# Patient Record
Sex: Male | Born: 1965
Health system: Southern US, Community
[De-identification: ages and names within clinical notes are randomized; demographics above are authoritative.]

## PROBLEM LIST (undated history)

## (undated) DIAGNOSIS — R51 Headache: Secondary | ICD-10-CM

## (undated) DIAGNOSIS — R519 Headache, unspecified: Secondary | ICD-10-CM

## (undated) DIAGNOSIS — T7840XA Allergy, unspecified, initial encounter: Secondary | ICD-10-CM

## (undated) DIAGNOSIS — K219 Gastro-esophageal reflux disease without esophagitis: Secondary | ICD-10-CM

## (undated) DIAGNOSIS — G43909 Migraine, unspecified, not intractable, without status migrainosus: Secondary | ICD-10-CM

## (undated) HISTORY — DX: Headache, unspecified: R51.9

## (undated) HISTORY — DX: Allergy, unspecified, initial encounter: T78.40XA

## (undated) HISTORY — DX: Gastro-esophageal reflux disease without esophagitis: K21.9

## (undated) HISTORY — DX: Migraine, unspecified, not intractable, without status migrainosus: G43.909

## (undated) HISTORY — DX: Headache: R51

## (undated) HISTORY — PX: NISSEN FUNDOPLICATION: SHX2091

---

## 1997-12-31 ENCOUNTER — Encounter: Payer: Self-pay | Admitting: Emergency Medicine

## 1997-12-31 ENCOUNTER — Emergency Department (HOSPITAL_COMMUNITY): Admission: EM | Admit: 1997-12-31 | Discharge: 1997-12-31 | Payer: Self-pay | Admitting: Emergency Medicine

## 1998-01-01 ENCOUNTER — Emergency Department (HOSPITAL_COMMUNITY): Admission: EM | Admit: 1998-01-01 | Discharge: 1998-01-01 | Payer: Self-pay | Admitting: Emergency Medicine

## 1998-01-02 ENCOUNTER — Emergency Department (HOSPITAL_COMMUNITY): Admission: EM | Admit: 1998-01-02 | Discharge: 1998-01-02 | Payer: Self-pay | Admitting: Emergency Medicine

## 1998-02-24 ENCOUNTER — Emergency Department (HOSPITAL_COMMUNITY): Admission: EM | Admit: 1998-02-24 | Discharge: 1998-02-24 | Payer: Self-pay | Admitting: Emergency Medicine

## 1998-09-09 ENCOUNTER — Ambulatory Visit (HOSPITAL_COMMUNITY): Admission: RE | Admit: 1998-09-09 | Discharge: 1998-09-09 | Payer: Self-pay | Admitting: Cardiovascular Disease

## 1998-09-09 ENCOUNTER — Encounter: Payer: Self-pay | Admitting: Cardiovascular Disease

## 1998-12-22 ENCOUNTER — Ambulatory Visit (HOSPITAL_COMMUNITY): Admission: RE | Admit: 1998-12-22 | Discharge: 1998-12-22 | Payer: Self-pay | Admitting: Cardiovascular Disease

## 1998-12-22 ENCOUNTER — Encounter: Payer: Self-pay | Admitting: Cardiovascular Disease

## 1999-08-20 ENCOUNTER — Encounter (HOSPITAL_BASED_OUTPATIENT_CLINIC_OR_DEPARTMENT_OTHER): Payer: Self-pay | Admitting: General Surgery

## 1999-08-20 ENCOUNTER — Ambulatory Visit (HOSPITAL_COMMUNITY): Admission: RE | Admit: 1999-08-20 | Discharge: 1999-08-20 | Payer: Self-pay | Admitting: General Surgery

## 1999-11-28 ENCOUNTER — Encounter: Payer: Self-pay | Admitting: Cardiovascular Disease

## 1999-11-28 ENCOUNTER — Encounter: Admission: RE | Admit: 1999-11-28 | Discharge: 1999-11-28 | Payer: Self-pay | Admitting: Cardiovascular Disease

## 1999-12-16 ENCOUNTER — Ambulatory Visit (HOSPITAL_COMMUNITY): Admission: RE | Admit: 1999-12-16 | Discharge: 1999-12-16 | Payer: Self-pay | Admitting: General Surgery

## 1999-12-16 ENCOUNTER — Encounter: Payer: Self-pay | Admitting: General Surgery

## 2000-03-20 ENCOUNTER — Ambulatory Visit (HOSPITAL_COMMUNITY): Admission: RE | Admit: 2000-03-20 | Discharge: 2000-03-20 | Payer: Self-pay | Admitting: Gastroenterology

## 2000-08-28 ENCOUNTER — Encounter: Admission: RE | Admit: 2000-08-28 | Discharge: 2000-08-28 | Payer: Self-pay | Admitting: Cardiovascular Disease

## 2000-08-28 ENCOUNTER — Encounter: Payer: Self-pay | Admitting: Cardiovascular Disease

## 2001-03-06 ENCOUNTER — Encounter: Payer: Self-pay | Admitting: Otolaryngology

## 2001-03-06 ENCOUNTER — Encounter: Admission: RE | Admit: 2001-03-06 | Discharge: 2001-03-06 | Payer: Self-pay | Admitting: Otolaryngology

## 2001-05-27 ENCOUNTER — Encounter: Admission: RE | Admit: 2001-05-27 | Discharge: 2001-05-27 | Payer: Self-pay | Admitting: Cardiovascular Disease

## 2001-05-27 ENCOUNTER — Encounter: Payer: Self-pay | Admitting: Cardiovascular Disease

## 2001-07-21 ENCOUNTER — Emergency Department (HOSPITAL_COMMUNITY): Admission: EM | Admit: 2001-07-21 | Discharge: 2001-07-21 | Payer: Self-pay

## 2001-08-07 ENCOUNTER — Encounter: Payer: Self-pay | Admitting: Gastroenterology

## 2001-08-07 ENCOUNTER — Ambulatory Visit (HOSPITAL_COMMUNITY): Admission: RE | Admit: 2001-08-07 | Discharge: 2001-08-07 | Payer: Self-pay | Admitting: Gastroenterology

## 2002-03-12 ENCOUNTER — Encounter: Admission: RE | Admit: 2002-03-12 | Discharge: 2002-03-12 | Payer: Self-pay | Admitting: Cardiovascular Disease

## 2002-03-12 ENCOUNTER — Encounter: Payer: Self-pay | Admitting: Cardiovascular Disease

## 2004-02-08 ENCOUNTER — Encounter: Admission: RE | Admit: 2004-02-08 | Discharge: 2004-02-08 | Payer: Self-pay | Admitting: Cardiovascular Disease

## 2005-09-18 ENCOUNTER — Encounter: Admission: RE | Admit: 2005-09-18 | Discharge: 2005-09-18 | Payer: Self-pay | Admitting: Cardiovascular Disease

## 2007-06-11 IMAGING — CR DG ABDOMEN 1V
2 series · 2 of 2 positions shown · non-contrast
Comparison: 02/08/04.

CLINICAL DATA: Facial pain.   Abdominal pain. 
 SINUSES ? 3 VIEW:

[view not recorded (1 of 2)]
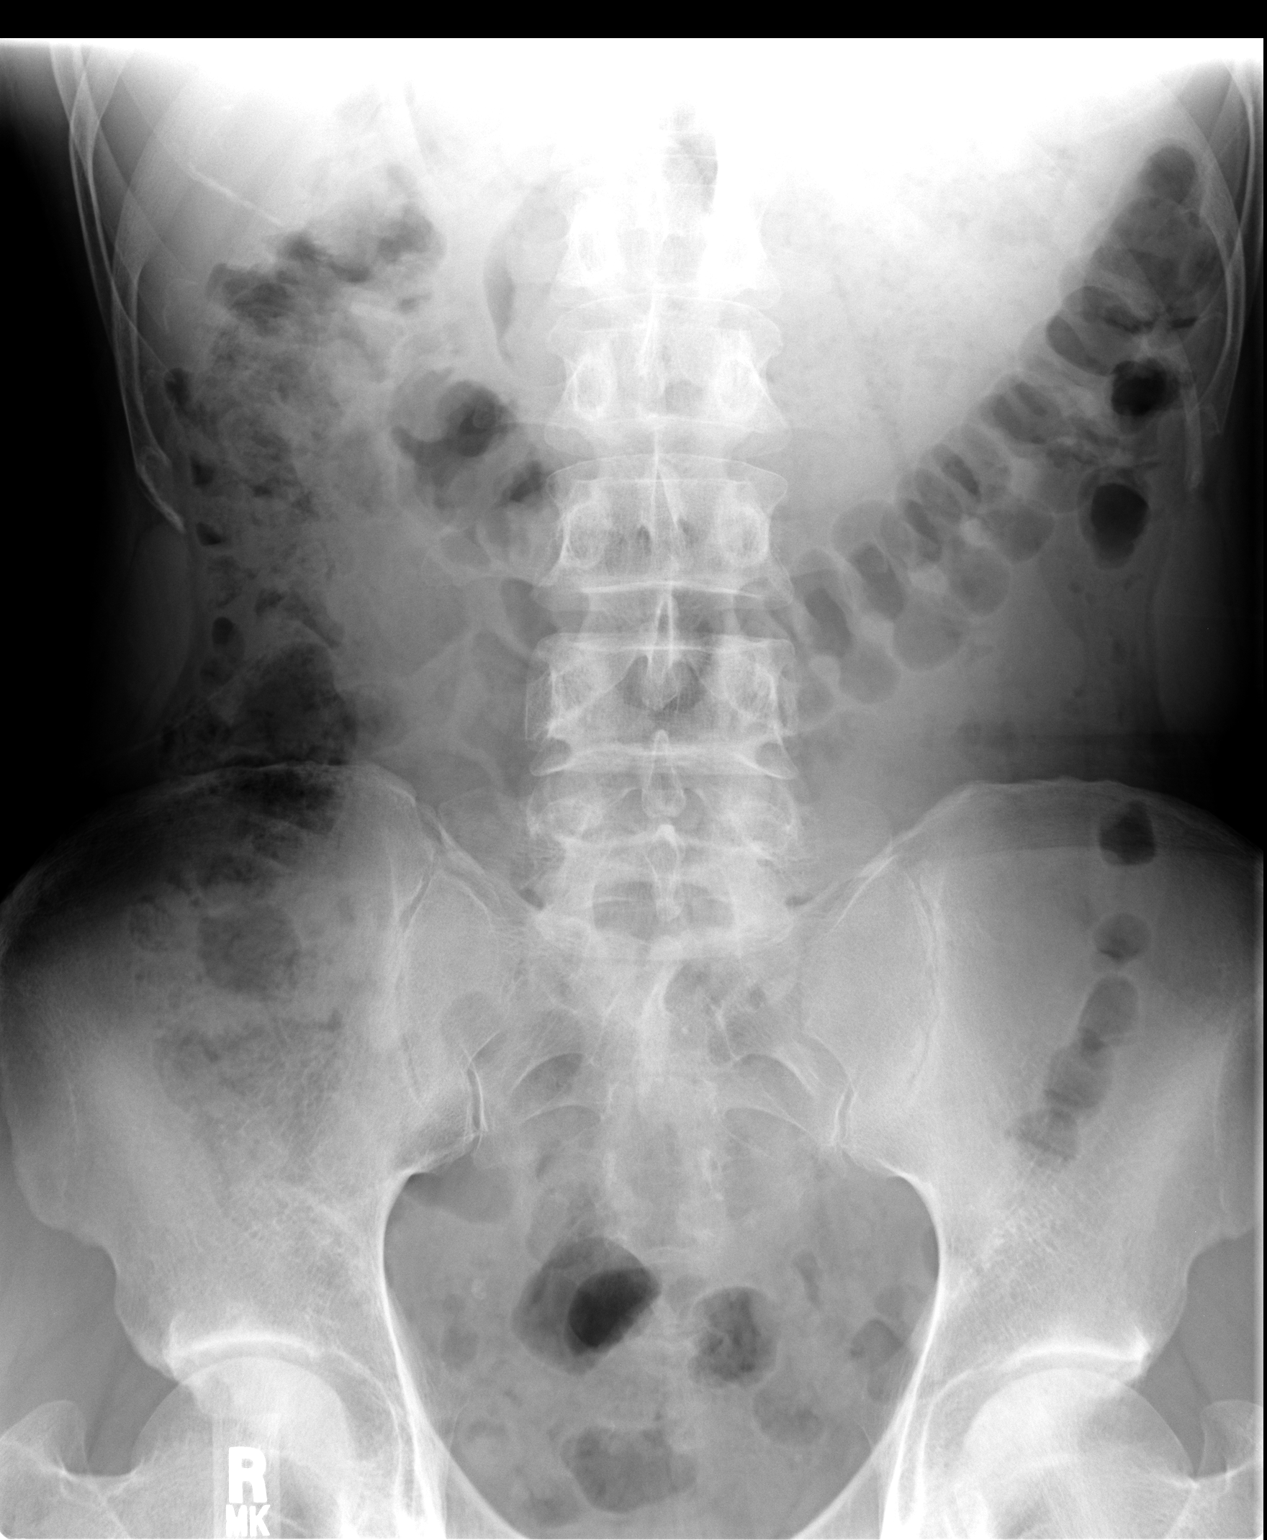

[view not recorded (2 of 2)]
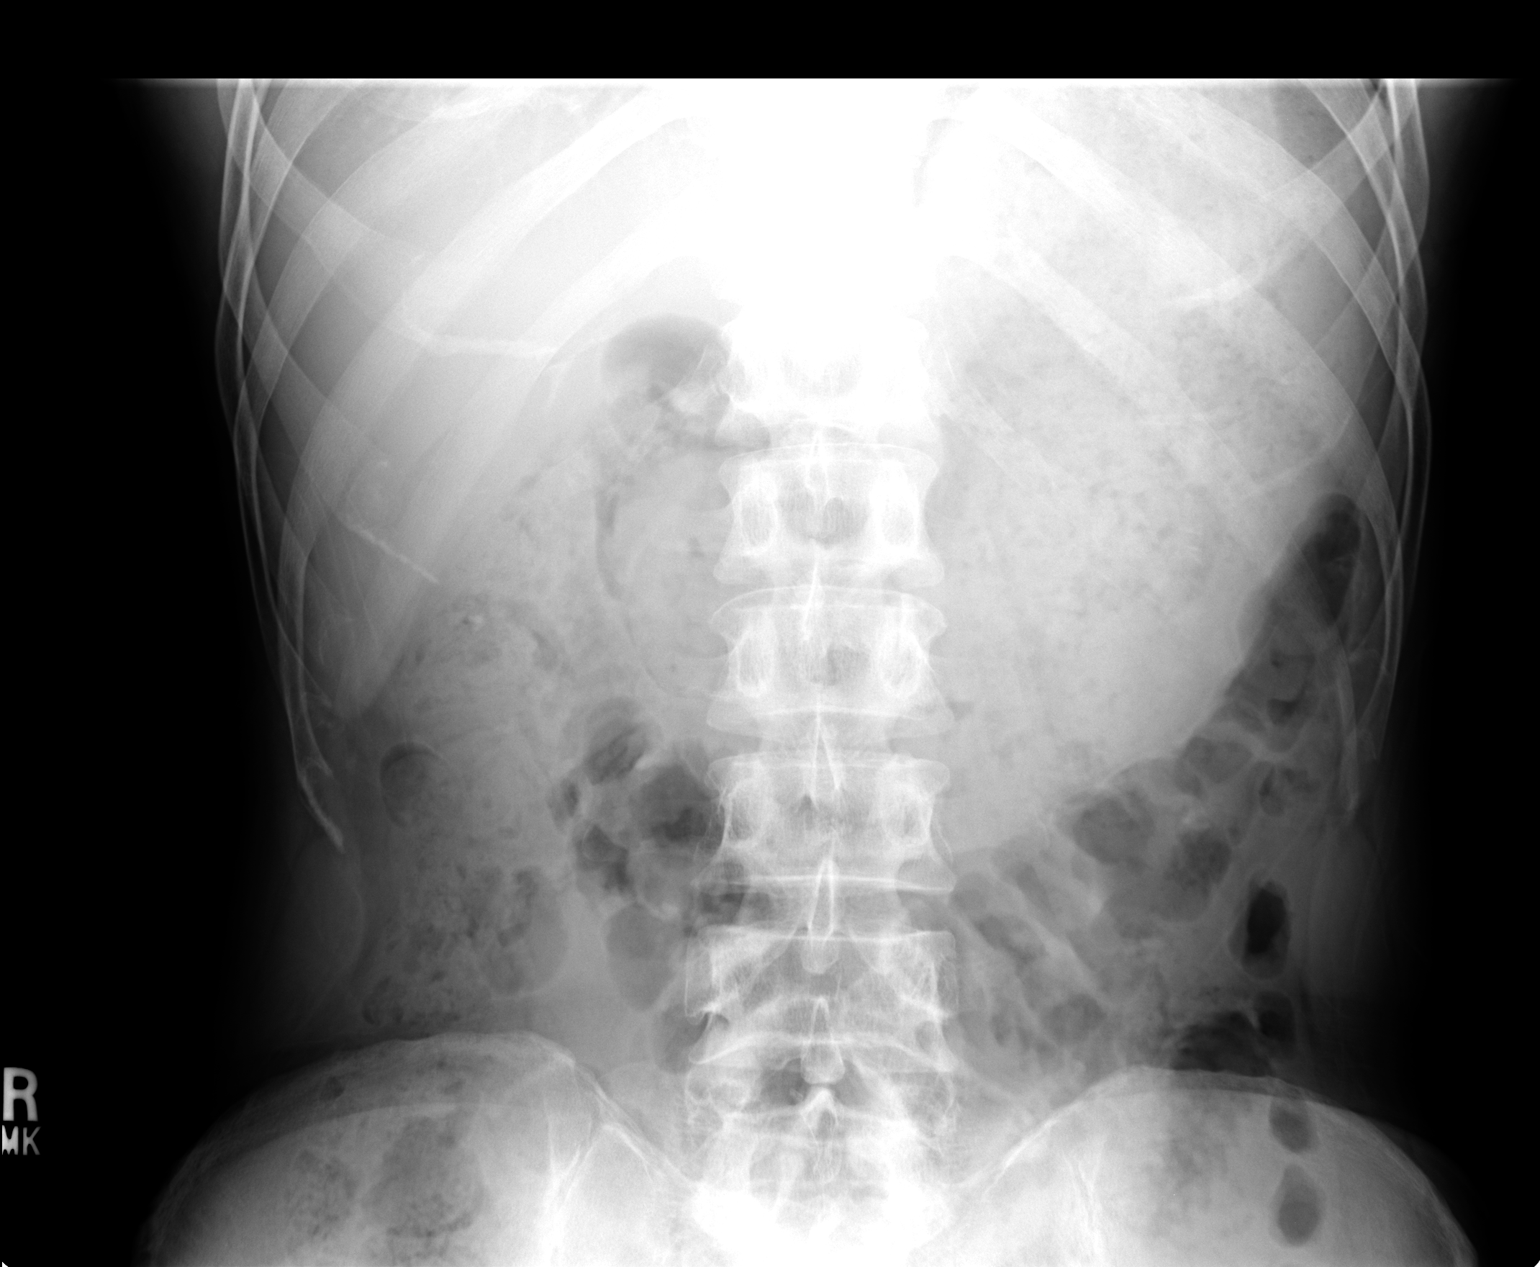

[2 of 2 positions shown; findings below may reference images not displayed]

FINDINGS: Three views of the paranasal sinuses were performed.  The paranasal sinuses are clear.  No bony abnormality is seen.
IMPRESSION: No evidence of sinusitis. 
 ABDOMEN ? 1 VIEW:
FINDINGS: A supine film of the abdomen shows no bowel obstruction.  The stomach, however, is noted to be very distended with fluid and food debris.  No opaque calculi are seen.
IMPRESSION: Distention of the stomach.  No bowel obstruction.  No opaque calculi.

## 2007-06-11 IMAGING — CR DG SINUSES COMPLETE 3+V
3 series · 3 of 3 positions shown · non-contrast
Comparison: 02/08/04.

CLINICAL DATA: Facial pain.   Abdominal pain. 
 SINUSES ? 3 VIEW:

[view not recorded (1 of 3)]
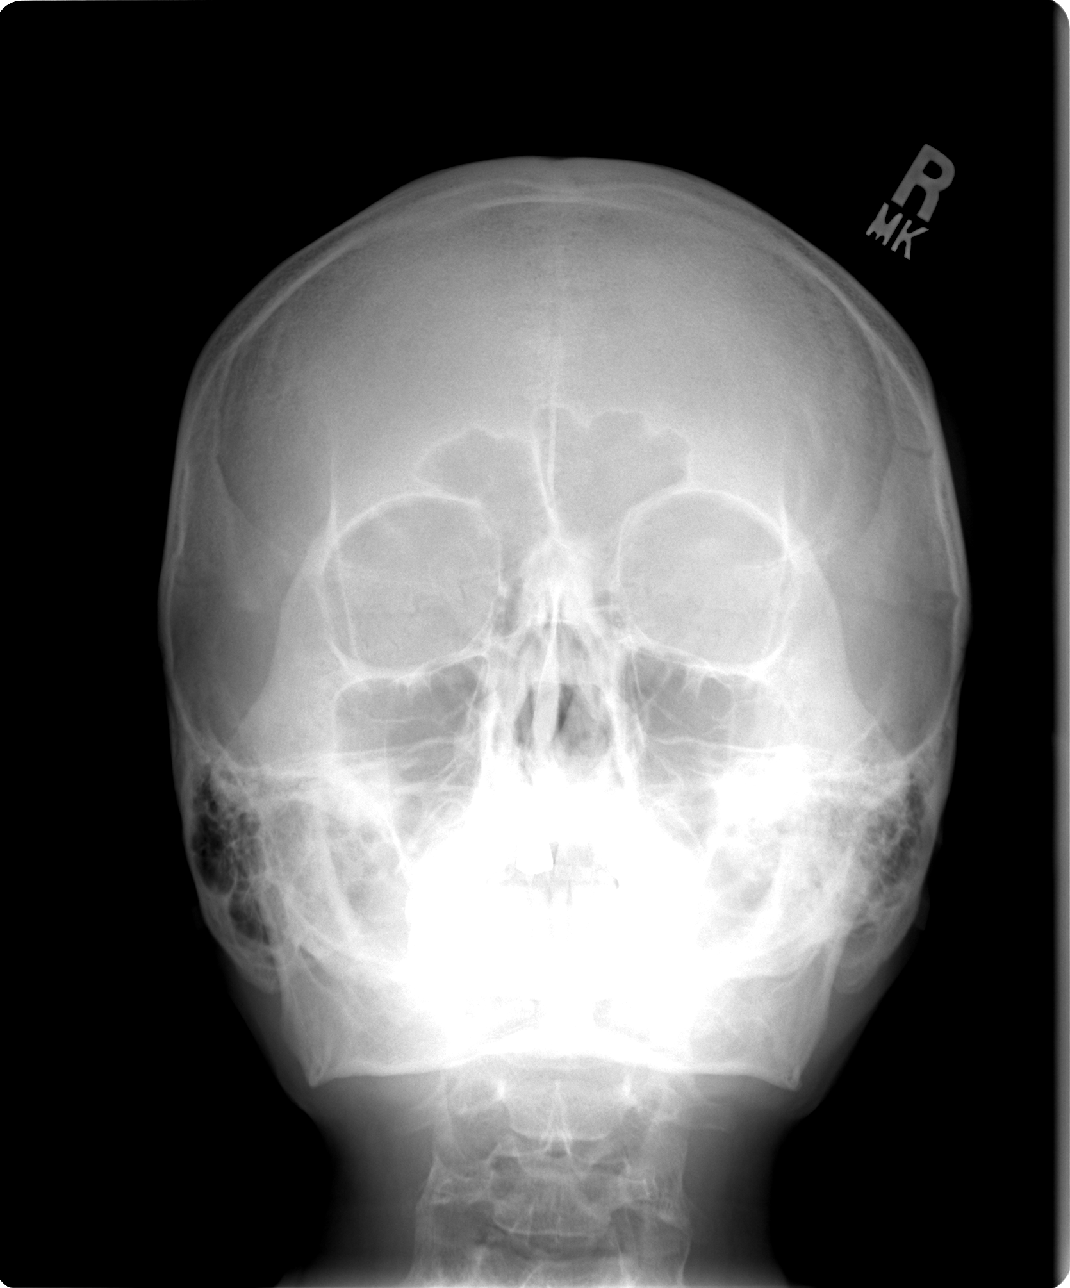

[view not recorded (2 of 3)]
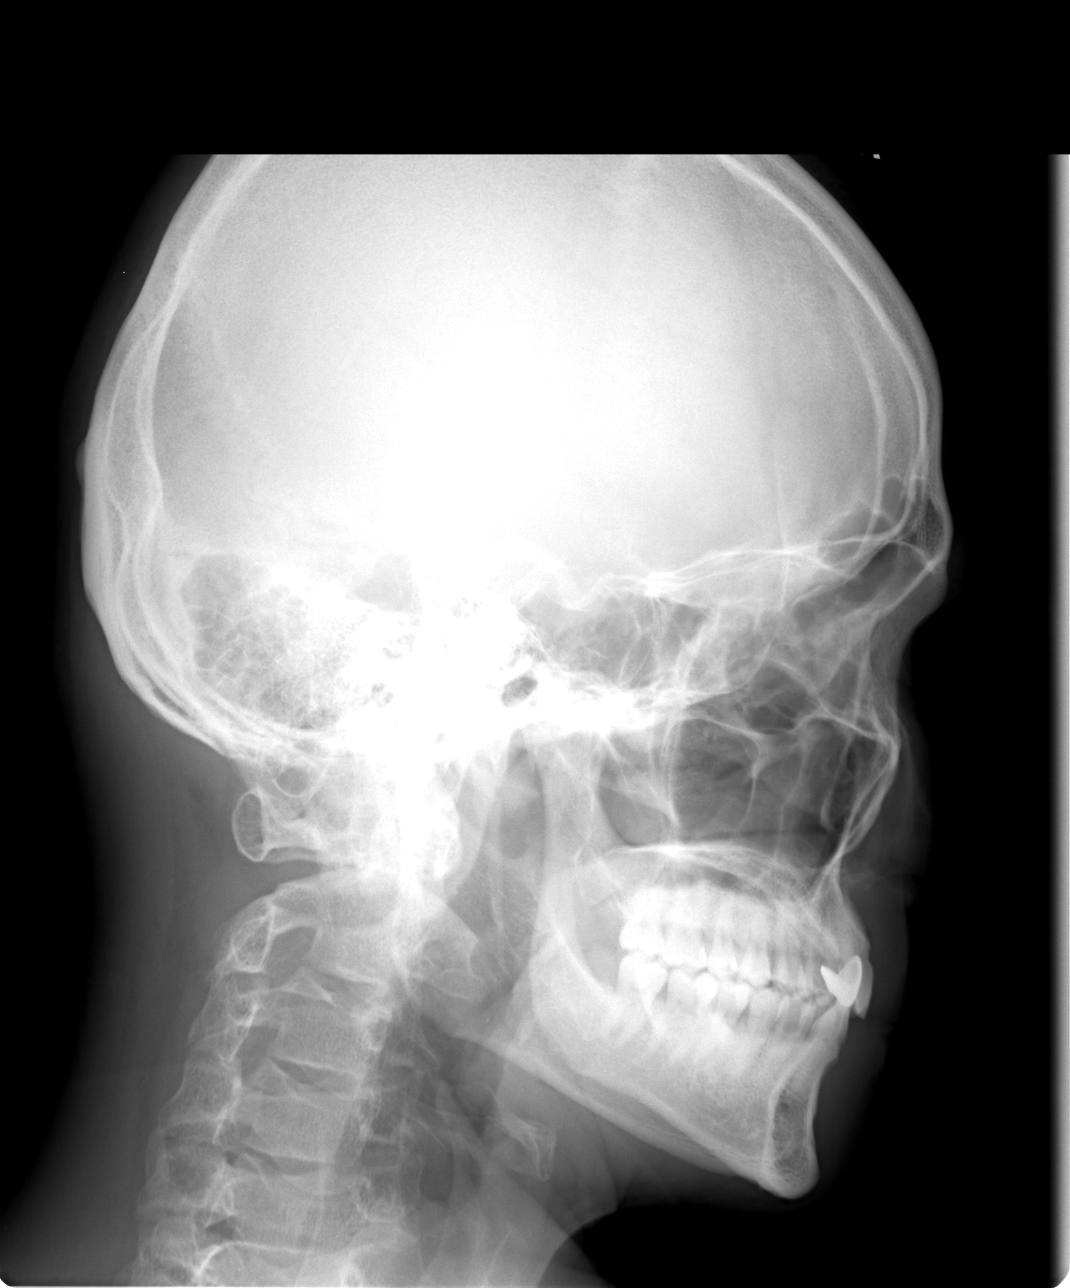

[view not recorded (3 of 3)]
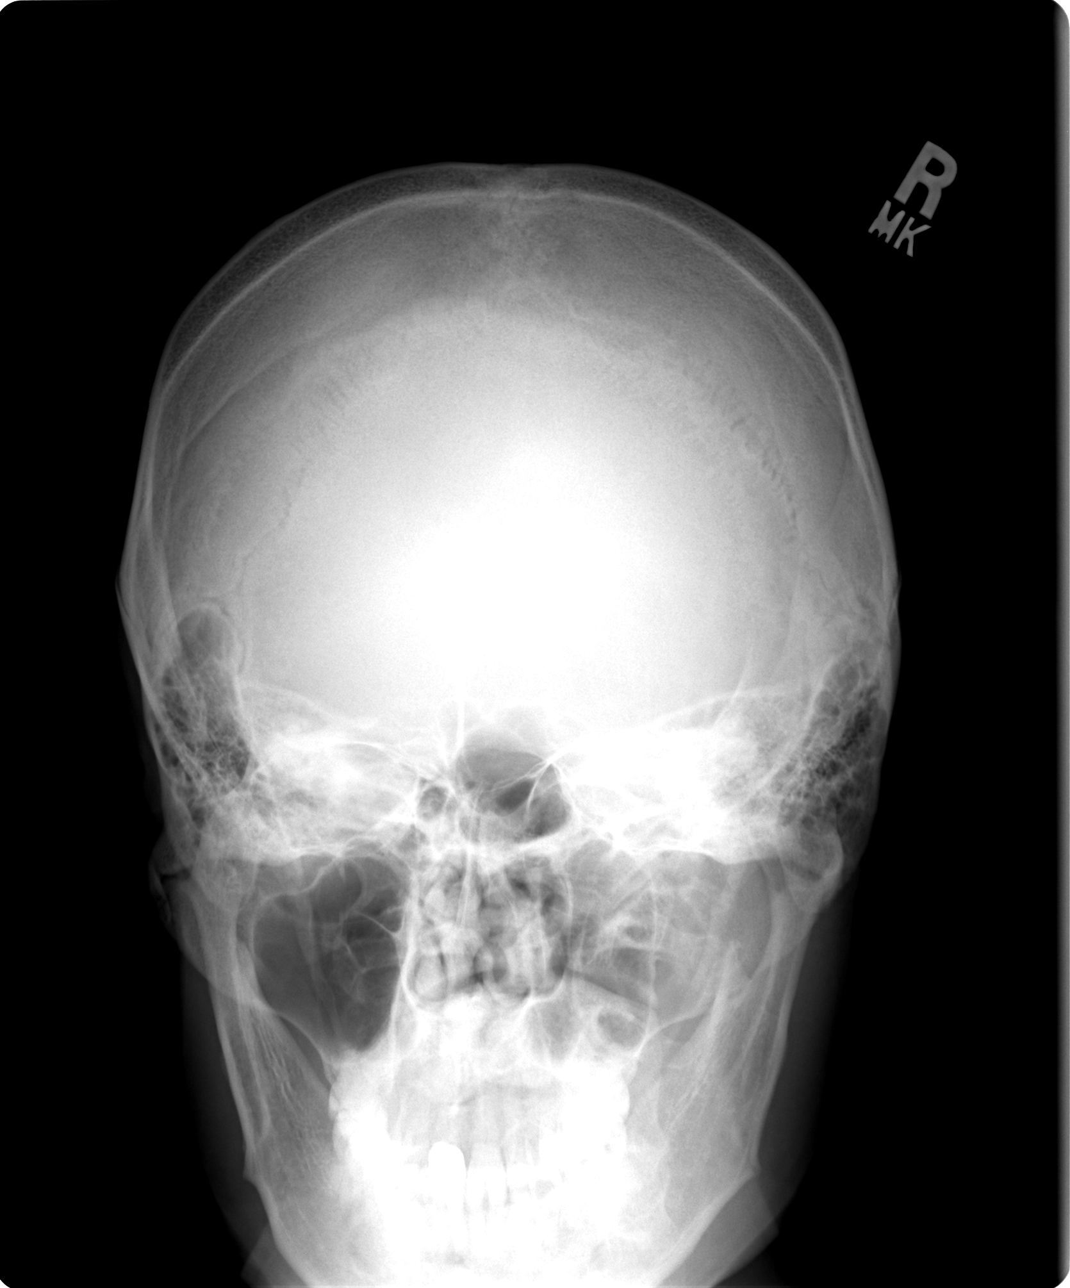

[3 of 3 positions shown; findings below may reference images not displayed]

FINDINGS: Three views of the paranasal sinuses were performed.  The paranasal sinuses are clear.  No bony abnormality is seen.
IMPRESSION: No evidence of sinusitis. 
 ABDOMEN ? 1 VIEW:
FINDINGS: A supine film of the abdomen shows no bowel obstruction.  The stomach, however, is noted to be very distended with fluid and food debris.  No opaque calculi are seen.
IMPRESSION: Distention of the stomach.  No bowel obstruction.  No opaque calculi.

## 2010-03-20 ENCOUNTER — Encounter: Payer: Self-pay | Admitting: Cardiovascular Disease

## 2012-08-15 ENCOUNTER — Ambulatory Visit (INDEPENDENT_AMBULATORY_CARE_PROVIDER_SITE_OTHER): Payer: BC Managed Care – PPO | Admitting: Family Medicine

## 2012-08-15 ENCOUNTER — Encounter: Payer: Self-pay | Admitting: Family Medicine

## 2012-08-15 VITALS — BP 110/70 | Temp 98.5°F | Wt 174.0 lb

## 2012-08-15 DIAGNOSIS — Z7189 Other specified counseling: Secondary | ICD-10-CM

## 2012-08-15 DIAGNOSIS — Z23 Encounter for immunization: Secondary | ICD-10-CM

## 2012-08-15 DIAGNOSIS — R519 Headache, unspecified: Secondary | ICD-10-CM

## 2012-08-15 DIAGNOSIS — Z7689 Persons encountering health services in other specified circumstances: Secondary | ICD-10-CM

## 2012-08-15 DIAGNOSIS — J309 Allergic rhinitis, unspecified: Secondary | ICD-10-CM

## 2012-08-15 DIAGNOSIS — R51 Headache: Secondary | ICD-10-CM

## 2012-08-15 LAB — LIPID PANEL
Cholesterol: 175 mg/dL (ref 0–200)
Triglycerides: 56 mg/dL (ref 0.0–149.0)
VLDL: 11.2 mg/dL (ref 0.0–40.0)

## 2012-08-15 LAB — HEMOGLOBIN A1C: Hgb A1c MFr Bld: 5.8 % (ref 4.6–6.5)

## 2012-08-15 LAB — CBC WITH DIFFERENTIAL/PLATELET
Basophils Relative: 0.6 % (ref 0.0–3.0)
Eosinophils Relative: 5.1 % — ABNORMAL HIGH (ref 0.0–5.0)
MCHC: 34 g/dL (ref 30.0–36.0)
Neutro Abs: 2.3 10*3/uL (ref 1.4–7.7)
Neutrophils Relative %: 49.2 % (ref 43.0–77.0)
Platelets: 188 10*3/uL (ref 150.0–400.0)
RBC: 4.71 Mil/uL (ref 4.22–5.81)

## 2012-08-15 LAB — BASIC METABOLIC PANEL
BUN: 19 mg/dL (ref 6–23)
CO2: 26 mEq/L (ref 19–32)
Chloride: 107 mEq/L (ref 96–112)
Creatinine, Ser: 1 mg/dL (ref 0.4–1.5)
GFR: 89.45 mL/min (ref >60.00)
Potassium: 4 mEq/L (ref 3.5–5.1)
Sodium: 138 mEq/L (ref 135–145)

## 2012-08-15 MED ORDER — SUMATRIPTAN SUCCINATE 50 MG PO TABS: ORAL_TABLET | ORAL | Status: DC

## 2012-08-15 MED ORDER — FLUTICASONE PROPIONATE 50 MCG/ACT NA SUSP: 2.0000 | Freq: Every day | NASAL | Status: DC

## 2012-08-15 NOTE — Patient Instructions (Addendum)
FOR ALLERGIES: -flonase 2 sprays each nostril every day for 1 month, then 1 spray each nostril -clariti or allegra daily  FOR HEADACHES: -keep a journal of headaches and what you ate the day or day before headaches -try imetrex per instructions  -We have ordered labs or studies at this visit. It can take up to 1-2 weeks for results and processing. We will contact you with instructions IF your results are abnormal. Normal results will be released to your [facility]. If you have not heard from [place] or can not find your results in [facility] in 2 weeks please contact our office.  -PLEASE SIGN UP FOR MYCHART TODAY   We recommend the following healthy lifestyle measures: - eat a healthy diet consisting of lots of vegetables, fruits, beans, nuts, seeds, healthy meats such as white chicken and fish and whole grains.  - avoid fried foods, fast food, processed foods, sodas, red meet and other fattening foods.  - get a least 150 minutes of aerobic exercise per week.   Follow up in: 1 month

## 2012-08-15 NOTE — Progress Notes (Signed)
Quick Note:  Called and left a message for pt to return call. ______ 

## 2012-08-15 NOTE — Progress Notes (Signed)
Chief Complaint  Patient presents with  . Establish Care    HPI:  Carl Moon is here to establish care.Has not had a PCP for a long times. Last PCP and physical:  Has the following chronic problems and concerns today:  Headaches: -has long history of headaches, but a little more frequent recently -headaches are in max sinus area and frontal area, more on R, some sinus issues -tylenol and OTC medications help -wears glasses, but has not seen eye doctor in many years - vision has changed -denies: fevers, chills, malaise, weight, light sensitivity, nausea  -has seen allergist in the past and has allergies and had shots temporarliy, occ uses antihistamine -sometimes band around head and in neck with headaches  There are no active problems to display for this patient.  Health Maintenance: -should get tdap today  ROS: See pertinent positives and negatives per HPI.  Past Medical History  Diagnosis Date  . GERD (gastroesophageal reflux disease)   . Migraine   . Frequent headaches   . Allergy     Family History  Problem Relation Age of Onset  . Heart disease Mother   . Hypertension Mother   . Colon cancer Mother 13  . Hypertension Father     History   Social History  . Marital Status: Married    Spouse Name: N/A    Number of Children: N/A  . Years of Education: N/A   Social History Main Topics  . Smoking status: Never Smoker   . Smokeless tobacco: None  . Alcohol Use: No  . Drug Use: None  . Sexually Active: None   Other Topics Concern  . 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  GENERAL: vitals reviewed and listed above, alert, oriented, appears well hydrated and in no acute distress  HEENT: atraumatic, conjunttiva clear, PERRLA, EOMI, no obvious abnormalities on inspection of external nose and ears, normal appearance of ear canals and TMs, clear nasal congestion with pale boggy turbinates, mild post oropharyngeal erythema with PND and cobblestoning, no tonsillar edema or exudate, no sinus TTP  NECK: no obvious masses on inspection  LUNGS: clear to auscultation bilaterally, no wheezes, rales or rhonchi, good air movement  CV: HRRR, no peripheral edema  MS: moves all extremities without noticeable abnormality  PSYCH: pleasant and cooperative, no obvious depression or anxiety  NEURO: CN II-XII grossly intact, finger to nose normal  ASSESSMENT AND PLAN:  Discussed the following assessment and plan:  Allergic rhinitis - Plan: fluticasone (FLONASE) 50 MCG/ACT nasal spray  Frequent headaches - Plan: SUMAtriptan (IMITREX) 50 MG tablet  Encounter to establish care - Plan: Lipid Panel, CBC with Differential, Basic metabolic panel,  Hemoglobin A1c  FASTING LABS, has not had any fluids in 12 hours  -We reviewed the PMH, PSH, FH, SH, Meds and Allergies. -We provided refills for any medications we will prescribe as needed. -We addressed current concerns per orders and patient instructions. -We have asked for records for pertinent exams, studies, vaccines and notes from previous providers. -We have advised patient to follow up per instructions below. -neuro exam normal, I think headaches are like allergic rhinosinusitis - tx per below, ? Possible migraine/tension type. Labs today and trial sumatriptan. Follow up in 1  month. -also have advised him to see eye doctor for eval -tdap today -follow up in 1 month    -Patient advised to return or notify a doctor immediately if symptoms worsen or persist or new concerns arise.  Patient Instructions       Terressa Koyanagi.

## 2012-08-15 NOTE — Addendum Note (Signed)
Addended by: Azucena Freed on: 08/15/2012 09:05 AM   Modules accepted: Orders

## 2012-08-16 NOTE — Progress Notes (Signed)
Quick Note:  Called and spoke with pt and pt is aware. ______ 

## 2012-09-23 ENCOUNTER — Ambulatory Visit: Payer: BC Managed Care – PPO | Admitting: Family Medicine

## 2013-08-28 ENCOUNTER — Encounter: Payer: Self-pay | Admitting: *Deleted

## 2013-12-22 ENCOUNTER — Ambulatory Visit (INDEPENDENT_AMBULATORY_CARE_PROVIDER_SITE_OTHER): Payer: BC Managed Care – PPO | Admitting: Family Medicine

## 2013-12-22 ENCOUNTER — Other Ambulatory Visit (INDEPENDENT_AMBULATORY_CARE_PROVIDER_SITE_OTHER): Payer: BC Managed Care – PPO

## 2013-12-22 ENCOUNTER — Encounter: Payer: Self-pay | Admitting: Family Medicine

## 2013-12-22 VITALS — BP 118/80 | HR 67 | Ht 71.0 in | Wt 184.0 lb

## 2013-12-22 DIAGNOSIS — M25512 Pain in left shoulder: Secondary | ICD-10-CM

## 2013-12-22 DIAGNOSIS — M7552 Bursitis of left shoulder: Secondary | ICD-10-CM

## 2013-12-22 DIAGNOSIS — Z23 Encounter for immunization: Secondary | ICD-10-CM

## 2013-12-22 DIAGNOSIS — M7550 Bursitis of unspecified shoulder: Secondary | ICD-10-CM | POA: Insufficient documentation

## 2013-12-22 NOTE — Assessment & Plan Note (Signed)
Patient was given an injection today and tolerated the procedure very well. Patient had near complete resolution of pain immediately. Differential also includes before meals joint arthritis and continued have pain I would consider injection in this joint. Home exercise program given as well as an icing protocol. Trial topical anti-inflammatories given today. Discussed proper lifting techniques. Discussed postural training that can be helpful as well. No signs of radicular symptoms from the cervical spine. Patient will try these interventions and come back and see me again in 3 weeks for further evaluation.

## 2013-12-22 NOTE — Patient Instructions (Signed)
Nice to meet you Ice 20 minutes 2 times daily. Usually after activity and before bed. Exercises 3 times a week.  Consider vitamin D 2000 IU daily.  Turmeric 500mg  twice daily In chair with tennis ball between shoulder blades.  On wall heels, butt shoulder and head against wall for 5 minutes daily.  Come back again in 3 weeks.

## 2013-12-22 NOTE — Progress Notes (Signed)
Carl ScaleZach Tresa Moon D.O. [facility] [address] [place], [place] [address] Phone: [phone](336) [phone] Subjective:    I'm seeing this patient by the request  of:  [name], Carl R., DO   CC: Left shoulder and neck pain  [id number]:Subjective Carl Moon is a 48 y.o. male coming in with complaint of left shoulder and neck pain. Patient has had this pain intermittently for quite some time but now the frequency and duration of the pain seems to be increasing. Patient states that the pain usually starts in his shoulder and can move up and radiate towards his neck. Patient states that certain motions with his shoulder can cause an audible pop that is somewhat painful. Patient does do a lot of exercising and heavy lifting and states that if he lifts with a straight arm he has more discomfort. Denies any significant radiation down the arm or any numbness or weakness. She does have a past medical history significant for rotator cuff tear on the contralateral side. Patient states that this pain does not feel similar to that. Patient has not tried any significant home modalities at this time. Still able to do daily activities but does have some soreness at night. Rates the severity of 6 out of 10. Denies having neck pain without the shoulder pain.    Past medical history is significant for neck pain previously. Patient did have an MRI of the cervical spine in 2000 was reviewed by me and showed  Past medical history, social, surgical and family history all reviewed in electronic medical record.   Review of Systems: No headache, visual changes, nausea, vomiting, diarrhea, constipation, dizziness, abdominal pain, skin rash, fevers, chills, night sweats, weight loss, swollen lymph nodes, body aches, joint swelling, muscle aches, chest pain, shortness of breath, mood changes.   Objective Blood pressure 118/80, pulse 67, height 5\' 11"  (1.803 m), weight 184 lb (83.462 kg), SpO2 98.00%.  General: No apparent distress alert  and oriented x3 mood and affect normal, dressed appropriately.  HEENT: Pupils equal, extraocular movements intact  Respiratory: Patient's speak in full sentences and does not appear short of breath  Cardiovascular: No lower extremity edema, non tender, no erythema  Skin: Warm dry intact with no signs of infection or rash on extremities or on axial skeleton.  Abdomen: Soft nontender  Neuro: Cranial nerves II through XII are intact, neurovascularly intact in all extremities with 2+ DTRs and 2+ pulses.  Lymph: No lymphadenopathy of posterior or anterior cervical chain or axillae bilaterally.  Gait normal with good balance and coordination.  MSK:  Non tender with full range of motion and good stability and symmetric strength and tone of  elbows, wrist, hip, knee and ankles bilaterally.  Neck: Inspection unremarkable. No palpable stepoffs. Negative Spurling's maneuver. Full neck range of motion Grip strength and sensation normal in bilateral hands Strength good C4 to T1 distribution No sensory change to C4 to T1 Negative Hoffman sign bilaterally Reflexes normal  Shoulder: Left Inspection reveals no abnormalities, atrophy or asymmetry. Palpation is normal with no tenderness over AC joint or bicipital groove. ROM is full in all planes. Rotator cuff strength normal throughout. Positive signs of impingement with Neer's and Hawkins Speeds and Yergason's tests normal. No labral pathology noted with negative Obrien's, negative clunk and good stability. Normal scapular function observed. No painful arc and no drop arm sign. No apprehension sign   MSK US performed of: Left shoulder This study was ordered, performed, and interpreted by Carl FilesZach Carl Moon D.O.  Shoulder:  Supraspinatus: Significant increased Doppler flow as well as hypoechoic changes at its insertion but no true tear appreciated. Infraspinatus:  Appears normal on long and transverse views. Subscapularis:  Significant hypoechoic  changes and on impingement view does have significant amount of impingement. Teres Minor:  Appears normal on long and transverse views. AC joint:  Mild osteophytic changes Glenohumeral Joint:  Appears normal without effusion. Glenoid Labrum:  Intact without visualized tears. Biceps Tendon:  Severe hypoechoic changes surrounding the tendon itself but no true tear appreciated.  Impression: Subacromial bursitis with biceps tendinitis  Procedure: Real-time Ultrasound Guided Injection of left glenohumeral joint Device: GE Logiq E  Ultrasound guided injection is preferred based studies that show increased duration, increased effect, greater accuracy, decreased procedural pain, increased response rate with ultrasound guided versus blind injection.  Verbal informed consent obtained.  Time-out conducted.  Noted no overlying erythema, induration, or other signs of local infection.  Skin prepped in a sterile fashion.  Local anesthesia: Topical Ethyl chloride.  With sterile technique and under real time ultrasound guidance:  Joint visualized.  23g 1  inch needle inserted posterior approach. Pictures taken for needle placement. Patient did have injection of 2 cc of 1% lidocaine, 2 cc of 0.5% Marcaine, and 1cc of Kenalog 40 mg/dL. Completed without difficulty  Pain immediately resolved suggesting accurate placement of the medication.  Advised to call if fevers/chills, erythema, induration, drainage, or persistent bleeding.  Images permanently stored and available for review in the ultrasound unit.  Impression: Technically successful ultrasound guided injection.      Impression and Recommendations:     This case required medical decision making of moderate complexity.

## 2014-01-13 ENCOUNTER — Ambulatory Visit (INDEPENDENT_AMBULATORY_CARE_PROVIDER_SITE_OTHER): Payer: BC Managed Care – PPO | Admitting: Family Medicine

## 2014-01-13 ENCOUNTER — Encounter: Payer: Self-pay | Admitting: Family Medicine

## 2014-01-13 VITALS — BP 118/70 | HR 55 | Ht 71.0 in | Wt 174.0 lb

## 2014-01-13 DIAGNOSIS — M999 Biomechanical lesion, unspecified: Secondary | ICD-10-CM

## 2014-01-13 DIAGNOSIS — M9901 Segmental and somatic dysfunction of cervical region: Secondary | ICD-10-CM

## 2014-01-13 DIAGNOSIS — M7552 Bursitis of left shoulder: Secondary | ICD-10-CM

## 2014-01-13 DIAGNOSIS — M9908 Segmental and somatic dysfunction of rib cage: Secondary | ICD-10-CM

## 2014-01-13 DIAGNOSIS — M9902 Segmental and somatic dysfunction of thoracic region: Secondary | ICD-10-CM

## 2014-01-13 DIAGNOSIS — M542 Cervicalgia: Secondary | ICD-10-CM

## 2014-01-13 NOTE — Progress Notes (Signed)
  Tawana ScaleZach Lynsee Wands D.O. [facility] [address] [place], [place] [address] Phone: [phone](336) [phone] Subjective:    I'm seeing this patient by the request  of:  [name], [name], DO   CC: Left shoulder and neck pain  KGM:[id number]:Subjective Carl Eather ColasKoh is a 48 y.o. male coming in with complaint of left shoulder and neck pain. Patient was seen previously and did have a subacromial bursitis of the left shoulder. Patient was given an injection. Patient tolerated the procedure well and states he is doing approximately 80% better. Patient states that when he reaches across his chest he has some mild discomfort but overall is continuing to do well. Patient states that it still gets some association of his neck. Patient has seen many different providers including chiropractor previously for this injury. Patient states that it is significantly better than prior to the injection but of course would like to be pain-free. Patient has not started his weight lifting regimen at this time. Denies any new symptoms.    Past medical history is significant for neck pain previously. Patient did have an MRI of the cervical spine in 2000 was reviewed by me and showed  Past medical history, social, surgical and family history all reviewed in electronic medical record.   Review of Systems: No headache, visual changes, nausea, vomiting, diarrhea, constipation, dizziness, abdominal pain, skin rash, fevers, chills, night sweats, weight loss, swollen lymph nodes, body aches, joint swelling, muscle aches, chest pain, shortness of breath, mood changes.   Objective Blood pressure 118/70, pulse 55, height 5\' 11"  (1.803 m), weight 174 lb (78.926 kg), SpO2 98 %.  General: No apparent distress alert and oriented x3 mood and affect normal, dressed appropriately.  HEENT: Pupils equal, extraocular movements intact  Respiratory: Patient's speak in full sentences and does not appear short of breath  Cardiovascular: No lower extremity edema,  non tender, no erythema  Skin: Warm dry intact with no signs of infection or rash on extremities or on axial skeleton.  Abdomen: Soft nontender  Neuro: Cranial nerves II through XII are intact, neurovascularly intact in all extremities with 2+ DTRs and 2+ pulses.  Lymph: No lymphadenopathy of posterior or anterior cervical chain or axillae bilaterally.  Gait normal with good balance and coordination.  MSK:  Non tender with full range of motion and good stability and symmetric strength and tone of  elbows, wrist, hip, knee and ankles bilaterally.  Neck: Inspection unremarkable. No palpable stepoffs. Negative Spurling's maneuver. Full neck range of motion Grip strength and sensation normal in bilateral hands Strength good C4 to T1 distribution No sensory change to C4 to T1 Negative Hoffman sign bilaterally Reflexes normal  Shoulder: Left Inspection reveals no abnormalities, atrophy or asymmetry. Palpation is normal with no tenderness over AC joint or bicipital groove. ROM is full in all planes. Rotator cuff strength normal throughout. Mild impingement sign still remaining but significantly better No labral pathology noted with negative Obrien's, negative clunk and good stability. Normal scapular function observed. No painful arc and no drop arm sign. No apprehension sign   Osteopathic findings Cervical C2 flexed rotated and side bent left C5 flexed rotated and side bent right  Thoracic T3 extended rotated and side bent left with inhaled third rib     Impression and Recommendations:     This case required medical decision making of moderate complexity.

## 2014-01-13 NOTE — Patient Instructions (Signed)
Good to see you Carl Moon is still your friend Continue the exercises 3 times a week for another 6 weeks.  On wall heels,m butt shoulder and head touching for a goal of 5 minutes. Daily.  Didi manipulation today, would have you come back for 4 weeks.

## 2014-01-13 NOTE — Assessment & Plan Note (Signed)
Decision today to treat with OMT was based on Physical Exam  After verbal consent patient was treated with HVLA, ME techniques in cervical, thoracici and rib areas  Patient tolerated the procedure well with improvement in symptoms  Patient given exercises, stretches and lifestyle modifications  See medications in patient instructions if given  Patient will follow up in 3 weeks

## 2014-01-13 NOTE — Assessment & Plan Note (Signed)
Patient is doing significantly better after the injection. Encourage him to continue the exercises on a regular basis for the next 3-6 weeks 3 times a week. We discussed the icing protocol and why this is important. Patient will avoid any significant overhead activity. Patient will come back and see me again in 3-4 weeks if pain continues to remain in further workup may be necessary.

## 2014-01-13 NOTE — Assessment & Plan Note (Signed)
Patient is doing well overall. Mild spasming of the neck. I do believe the patient may has some muscle imbalances. Patient was given different postural exercises that I think will be beneficial and we discussed over-the-counter medicines. Patient will come back and see me again and wanted to weeks for further evaluation and treatment.

## 2014-02-03 ENCOUNTER — Encounter: Payer: Self-pay | Admitting: Family Medicine

## 2014-02-03 ENCOUNTER — Ambulatory Visit (INDEPENDENT_AMBULATORY_CARE_PROVIDER_SITE_OTHER): Payer: BC Managed Care – PPO | Admitting: Family Medicine

## 2014-02-03 VITALS — BP 100/70 | HR 58 | Temp 97.9°F | Ht 71.0 in | Wt 178.8 lb

## 2014-02-03 DIAGNOSIS — J320 Chronic maxillary sinusitis: Secondary | ICD-10-CM

## 2014-02-03 DIAGNOSIS — R519 Headache, unspecified: Secondary | ICD-10-CM

## 2014-02-03 DIAGNOSIS — G43809 Other migraine, not intractable, without status migrainosus: Secondary | ICD-10-CM

## 2014-02-03 DIAGNOSIS — R51 Headache: Secondary | ICD-10-CM

## 2014-02-03 MED ORDER — PROMETHAZINE HCL 12.5 MG PO TABS: 12.5000 mg | ORAL_TABLET | Freq: Three times a day (TID) | ORAL | Status: DC | PRN

## 2014-02-03 MED ORDER — SUMATRIPTAN SUCCINATE 50 MG PO TABS: ORAL_TABLET | ORAL | Status: DC

## 2014-02-03 MED ORDER — DOXYCYCLINE HYCLATE 100 MG PO TABS: 100.0000 mg | ORAL_TABLET | Freq: Two times a day (BID) | ORAL | Status: DC

## 2014-02-03 NOTE — Progress Notes (Signed)
Pre visit review using our clinic review tool, if applicable. No additional management support is needed unless otherwise documented below in the visit note. 

## 2014-02-03 NOTE — Progress Notes (Signed)
HPI:  URI: -started: 3 weeks ago -symptoms:nasal congestion, sore throat, cough, now with maxillary sinus pain - also has had 3 migraines accompanied by nausea this month, none in the last 2 weeks  -denies:fever, SOB, NVD, tooth pain -has tried: otc medications -sick contacts/travel/risks: denies flu exposure, tick exposure or or Ebola risks -Hx of: allergies -denies: fevers, SOB, wheezing, body aches  ROS: See pertinent positives and negatives per HPI.  Past Medical History  Diagnosis Date  . GERD (gastroesophageal reflux disease)   . Migraine   . Frequent headaches   . Allergy     Past Surgical History  Procedure Laterality Date  . Nissen fundoplication      Family History  Problem Relation Age of Onset  . Heart disease Mother   . Hypertension Mother   . Colon cancer Mother [id number]  . Hypertension Father     History   Social History  . Marital Status: Married    Spouse Name: N/A    Number of Children: N/A  . Years of Education: N/A   Social History Main Topics  . Smoking status: Never Smoker   . Smokeless tobacco: None  . Alcohol Use: No  . Drug Use: None  . Sexual Activity: None   Other Topics Concern  . 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      Home Situation: lives with wife and 2 daughters      Spiritual Beliefs: none      Lifestyle: gym 7 days per week; diet is so so             Current outpatient prescriptions: Acetaminophen-Caffeine (TENSION HEADACHE PO), Take by mouth as needed., Disp: , Rfl: ;  doxycycline (VIBRA-TABS) 100 MG tablet, Take 1 tablet (100 mg total) by mouth 2 (two) times daily., Disp: 20 tablet, Rfl: 0;  promethazine (PHENERGAN) 12.5 MG tablet, Take 1 tablet (12.5 mg total) by mouth every 8 (eight) hours as needed for nausea or vomiting., Disp: 20 tablet, Rfl: 0 SUMAtriptan (IMITREX) 50 MG tablet, 1 tablet at onset of headaches, then one more in 2 hours if still have headache, no  more then 2 tablets in 24 hours, Disp: 10 tablet, Rfl: 0  EXAM:  Filed Vitals:   02/03/14 1458  BP: 100/70  Pulse: 58  Temp: 97.9 F (36.6 C)    Body mass index is 24.95 kg/(m^2).  GENERAL: vitals reviewed and listed above, alert, oriented, appears well hydrated and in no acute distress  HEENT: atraumatic, conjunttiva clear, no obvious abnormalities on inspection of external nose and ears, normal appearance of ear canals and TMs, thick nasal congestion, mild post oropharyngeal erythema with PND, no tonsillar edema or exudate, R max sinus sinus TTP  NECK: no obvious masses on inspection  LUNGS: clear to auscultation bilaterally, no wheezes, rales or rhonchi, good air movement  CV: HRRR, no peripheral edema  MS: moves all extremities without noticeable abnormality  PSYCH: pleasant and cooperative, no obvious depression or anxiety  ASSESSMENT AND PLAN:  Discussed the following assessment and plan:  Maxillary sinusitis, unspecified chronicity - Plan: doxycycline (VIBRA-TABS) 100 MG tablet, promethazine (PHENERGAN) 12.5 MG tablet  Other migraine without status migrainosus, not intractable  Frequent headaches - Plan: SUMAtriptan (IMITREX) 50 MG tablet  -given HPI and exam findings today, a serious infection or illness is unlikely. We discussed potential etiologies, with VURI being most likely, and advised supportive care and monitoring. We discussed treatment  side effects, likely course, antibiotic misuse, transmission, and signs of developing a serious illness. -of course, we advised to return or notify a doctor immediately if symptoms worsen or persist or new concerns arise.    Patient Instructions  BEFORE YOU LEAVE: -physical in 1 month  -take the antibiotic   -keep a log of triggers for migraine and try the migraine medication if recurs  -schedule physical in the morning in about 1 month and to follow up     KIM, [name]

## 2014-02-03 NOTE — Patient Instructions (Signed)
BEFORE YOU LEAVE: -physical in 1 month  -take the antibiotic   -keep a log of triggers for migraine and try the migraine medication if recurs  -schedule physical in the morning in about 1 month and to follow up

## 2014-02-10 ENCOUNTER — Ambulatory Visit (INDEPENDENT_AMBULATORY_CARE_PROVIDER_SITE_OTHER): Payer: BC Managed Care – PPO | Admitting: Family Medicine

## 2014-02-10 ENCOUNTER — Encounter: Payer: Self-pay | Admitting: Family Medicine

## 2014-02-10 VITALS — BP 108/64 | HR 53 | Ht 71.0 in | Wt 181.0 lb

## 2014-02-10 DIAGNOSIS — M9902 Segmental and somatic dysfunction of thoracic region: Secondary | ICD-10-CM

## 2014-02-10 DIAGNOSIS — M9901 Segmental and somatic dysfunction of cervical region: Secondary | ICD-10-CM

## 2014-02-10 DIAGNOSIS — M899 Disorder of bone, unspecified: Secondary | ICD-10-CM

## 2014-02-10 DIAGNOSIS — M999 Biomechanical lesion, unspecified: Secondary | ICD-10-CM

## 2014-02-10 DIAGNOSIS — M9908 Segmental and somatic dysfunction of rib cage: Secondary | ICD-10-CM

## 2014-02-10 DIAGNOSIS — M542 Cervicalgia: Secondary | ICD-10-CM

## 2014-02-10 NOTE — Assessment & Plan Note (Signed)
No significant decrease in range of motion at this time. Lungs patient continues to improve with conservative therapy will continue. Patient has any worsening symptoms I would like to get x-rays for further evaluation of the cervical spine. No masses noted today. We discussed topical anti-inflammatories as well as home exercises. Patient will come back and see me again in 4 weeks for further evaluation and treatment.

## 2014-02-10 NOTE — Patient Instructions (Addendum)
Good to see you You are doing great Continue with the wall exercise for goal of 5 minutes a day  New exercises 3 times a week for the scapula.  Ice is still your friend See me again in 1 month for more manipulation  Use pennsaid twice daily as needed   Scapular Winging  with Rehab  Scapular winging syndrome is also known as serratus anterior palsy or long thoracic nerve injury. The condition is an uncommon injury to the nervous system. The condition is caused by injury to the long thoracic nerve that runs through the neck and shoulder. Injury to the shoulder, such as a fall or repetitive stress on the shoulder causes the nerve to become stretched. Occasionally the injury is the result of an infection of the nerve. Damage to the long thoracic nerve results in weakness of the serratus anterior muscle. The serratus anterior muscle is responsible for controlling the shoulder blade (scapula). Weakness in this muscle results in a instability (winging) of the scapula. SYMPTOMS   Pain and weakness in the shoulder (usually the back of the shoulder) that is often diffuse or unable to localize.  Loss of or decrease in shoulder function.  Upper back pain while sitting, due to the scapula pressing on the back of the chair.  Visible deformity in the back of the shoulder. CAUSES  Scapular winging is caused by stretching of the long thoracic nerve. Common mechanisms of injury include:  Viral illness.  Repetitive and/or stressful use of the shoulder.  Falling onto the shoulder with the head and neck stretched away from the shoulder. RISK INCREASES WITH:  Contact sports (football, rugby, lacrosse, or soccer).  Activities involving overhead arm movement (baseball, volleyball, or racquet sports).  Poor strength and flexibility. PREVENTION  Warm up and stretch properly before activity.  Allow for adequate recovery between workouts.  Maintain physical fitness:  Strength, flexibility, and  endurance.  Cardiovascular fitness.  Learn and use proper technique. When possible, have a coach correct improper technique. PROGNOSIS  Scapular winging normally resolves spontaneously within 18 months. In rare circumstances surgery is recommended.  RELATED COMPLICATIONS   Permanent nerve damage, including pain, numbness, tingle, or weakness.  Shoulder weakness.  Recurrent shoulder pain.  Inability to compete in athletics. TREATMENT Treatment initially involves resting from any activities that aggravate your symptoms. The use of ice and medication may help reduce pain and inflammation. The use of strengthening and stretching exercises may help reduce pain with activity, specifically shoulder exercises that improve range of motion. These exercises may be performed at home or with referral to a therapist. If symptoms persist for greater than 6 months despite non-surgical (conservative) treatment, then surgery may be recommended. Surgery is only used for the most serious cases and the purpose is to regain function, not to allow an athlete to return to sports. MEDICATION   If pain medication is necessary, then nonsteroidal anti-inflammatory medications, such as aspirin and ibuprofen, or other minor pain relievers, such as acetaminophen, are often recommended.  Do not take pain medication for 7 days before surgery.  Prescription pain relievers may be given if deemed necessary by your caregiver. Use only as directed and only as much as you need. HEAT AND COLD  Cold treatment (icing) relieves pain and reduces inflammation. Cold treatment should be applied for 10 to 15 minutes every 2 to 3 hours for inflammation and pain and immediately after any activity that aggravates your symptoms. Use ice packs or massage the area with a piece  of ice (ice massage).  Heat treatment may be used prior to performing the stretching and strengthening activities prescribed by your caregiver, physical therapist, or  athletic trainer. Use a heat pack or soak the injury in warm water. SEEK MEDICAL CARE IF:  Treatment seems to offer no benefit, or the condition worsens.  Any medications produce adverse side effects. EXERCISES  RANGE OF MOTION (ROM) AND STRETCHING EXERCISES - Scapular Winging (Serratus Anterior Palsy, Long Thoracic Nerve Injury)  These exercises may help you when beginning to rehabilitate your injury. Your symptoms may resolve with or without further involvement from your physician, physical therapist or athletic trainer. While completing these exercises, remember:   Restoring tissue flexibility helps normal motion to return to the joints. This allows healthier, less painful movement and activity.  An effective stretch should be held for at least 30 seconds.  A stretch should never be painful. You should only feel a gentle lengthening or release in the stretched tissue. ROM - Pendulum  Bend at the waist so that your right / left arm falls away from your body. Support yourself with your opposite hand on a solid surface, such as a table or a countertop.  Your right / left arm should be perpendicular to the ground. If it is not perpendicular, you need to lean over farther. Relax the muscles in your right / left arm and shoulder as much as possible.  Gently sway your hips and trunk so they move your right / left arm without any use of your right / left shoulder muscles.  Progress your movements so that your right / left arm moves side to side, then forward and backward, and finally, both clockwise and counterclockwise.  Complete __________ repetitions in each direction. Many people use this exercise to relieve discomfort in their shoulder as well as to gain range of motion. Repeat __________ times. Complete this exercise __________ times per day. STRETCH - Flexion, Seated   Sit in a firm chair so that your right / left forearm can rest on a table or on a table or countertop. Your right /  left elbow should rest below the height of your shoulder so that your shoulder feels supported and not tense or uncomfortable.  Keeping your right / left shoulder relaxed, lean forward at your waist, allowing your right / left hand to slide forward. Bend forward until you feel a moderate stretch in your shoulder, but before you feel an increase in your pain.  Hold __________ seconds. Slowly return to your starting position. Repeat __________ times. Complete this exercise __________ times per day.  STRETCH - Flexion, Standing  Stand with good posture. With an underhand grip on your right / left and an overhand grip on the opposite hand, grasp a broomstick or cane so that your hands are a little more than shoulder-width apart.  Keeping your right / left elbow straight and shoulder muscles relaxed, push the stick with your opposite hand to raise your right / left arm in front of your body and then overhead. Raise your arm until you feel a stretch in your right / left shoulder, but before you have increased shoulder pain.  Avoid shrugging your right / left shoulder as your arm rises by keeping your shoulder blade tucked down and toward your mid-back spine. Hold __________ seconds.  Slowly return to the starting position. Repeat __________ times. Complete this exercise __________ times per day. STRETCH - Abduction, Supine  Stand with good posture. With an underhand grip on  your right / left and an overhand grip on the opposite hand, grasp a broomstick or cane so that your hands are a little more than shoulder-width apart.  Keeping your right / left elbow straight and shoulder muscles relaxed, push the stick with your opposite hand to raise your right / left arm out to the side of your body and then overhead. Raise your arm until you feel a stretch in your right / left shoulder, but before you have increased shoulder pain.  Avoid shrugging your right / left shoulder as your arm rises by keeping your  shoulder blade tucked down and toward your mid-back spine. Hold __________ seconds.  Slowly return to the starting position. Repeat __________ times. Complete this exercise __________ times per day. ROM - Flexion, Active-Assisted  Lie on your back. You may bend your knees for comfort.  Grasp a broomstick or cane so your hands are about shoulder-width apart. Your right / left hand should grip the end of the stick/cane so that your hand is positioned "thumbs-up," as if you were about to shake hands.  Using your healthy arm to lead, raise your right / left arm overhead until you feel a gentle stretch in your shoulder. Hold __________ seconds.  Use the stick/cane to assist in returning your right / left arm to its starting position. Repeat __________ times. Complete this exercise __________ times per day.  STRENGTHENING EXERCISES - Scapular Winging (Serratus Anterior Palsy, Long Thoracic Nerve Injury) These exercises may help you when beginning to rehabilitate your injury. They may resolve your symptoms with or without further involvement from your physician, physical therapist or athletic trainer. While completing these exercises, remember:   Muscles can gain both the endurance and the strength needed for everyday activities through controlled exercises.  Complete these exercises as instructed by your physician, physical therapist or athletic trainer. Progress with the resistance and repetition exercises only as your caregiver advises.  You may experience muscle soreness or fatigue, but the pain or discomfort you are trying to eliminate should never worsen during these exercises. If this pain does worsen, stop and make certain you are following the directions exactly. If the pain is still present after adjustments, discontinue the exercise until you can discuss the trouble with your clinician.  During your recovery, avoid activity or exercises which involve actions that place your injured hand or  elbow above your head or behind your back or head. These positions stress the tissues which are trying to heal. STRENGTH - Scapular Depression and Adduction   With good posture, sit on a firm chair. Supported your arms in front of you with pillows, arm rests or a table top. Have your elbows in line with the sides of your body.  Gently draw your shoulder blades down and toward your mid-back spine. Gradually increase the tension without tensing the muscles along the top of your shoulders and the back of your neck.  Hold for __________ seconds. Slowly release the tension and relax your muscles completely before completing the next repetition.  After you have practiced this exercise, remove the arm support and complete it in standing as well as sitting. Repeat __________ times. Complete this exercise __________ times per day.  STRENGTH - Scapular Protractors, Standing   Stand arms-length away from a wall. Place your hands on the wall, keeping your elbows straight.  Begin by dropping your shoulder blades down and toward your mid-back spine.  To strengthen your protractors, keep your shoulder blades down, but slide them  forward on your rib cage. It will feel as if you are lifting the back of your rib cage away from the wall. This is a subtle motion and can be challenging to complete. Ask your clinician for further instruction if you are not sure you are doing the exercise correctly.  Hold for __________ seconds. Slowly return to the starting position, resting the muscles completely before completing the next repetition. Repeat __________ times. Complete this exercise __________ times per day. STRENGTH - Scapular Protractors, Supine  Lie on your back on a firm surface. Extend your right / left arm straight into the air while holding a __________ weight in your hand.  Keeping your head and back in place, lift your shoulder off the floor.  Hold __________ seconds. Slowly return to the starting  position and allow your muscles to relax completely before completing the next repetition. Repeat __________ times. Complete this exercise __________ times per day. STRENGTH - Scapular Protractors, Quadruped  Get onto your hands and knees with your shoulders directly over your hands (or as close as you comfortably can be).  Keeping your elbows locked, lift the back of your rib cage up into your shoulder blades so your mid-back rounds-out. Keep your neck muscles relaxed.  Hold this position for __________ seconds. Slowly return to the starting position and allow your muscles to relax completely before completing the next repetition. Repeat __________ times. Complete this exercise __________ times per day.  STRENGTH - Scapular Depressors  Keeping your feet on the floor, lift your bottom from the seat and lock your elbows.  Keeping your elbows straight, allow gravity to pull your body weight down. Your shoulders will rise toward your ears.  Raise your body against gravity by drawing your shoulder blades down your back, shortening the distance between your shoulders and ears. Although your feet should always maintain contact with the floor, your feet should progressively support less body weight as you get stronger.  Hold __________ seconds. In a controlled and slow manner, lower your body weight to begin the next repetition. Repeat __________ times. Complete this exercise __________ times per day.  STRENGTH - Shoulder Extensors, Prone  Lie on your stomach on a firm surface so that your right / left arm overhangs the edge. Rest your forehead on your opposite forearm. With your thumb facing away from your body and your elbow straight, hold a __________ weight in your hand.  Squeeze your right / left shoulder blade to your mid-back spine and then slowly raise your arm behind you to the height of the bed.  Hold for __________ seconds. Slowly reverse the directions and return to the starting  position, controlling the weight as you lower your arm. Repeat __________ times. Complete this exercise __________ times per day.  STRENGTH - Horizontal Abductors Choose one of the two oppositions to complete this exercise. Prone: lying on stomach:  Lie on your stomach on a firm surface so that your right / left arm overhangs the edge. Rest your forehead on your opposite forearm. With your palm facing the floor and your elbow straight, hold a __________ weight in your hand.  Squeeze your right / left shoulder blade to your mid-back spine and then slowly raise your arm to the height of the bed.  Hold for __________ seconds. Slowly reverse the directions and return to the starting position, controlling the weight as you lower your arm. Repeat __________ times. Complete this exercise __________ times per day. Standing:  Secure a rubber exercise band/tubing  so that it is at the height of your shoulders when you are either standing or sitting on a firm arm-less chair.  Grasp an end of the band/tubing in each hand and have your palms face each other. Straighten your elbows and lift your hands straight in front of you at shoulder height. Step back away from the secured end of band/tubing until it becomes tense.  Squeeze your shoulder blades together. Keeping your elbows locked and your hands at shoulder-height, bring your hands out to your side.  Hold __________ seconds. Slowly ease the tension on the band/tubing as you reverse the directions and return to the starting position. Repeat __________ times. Complete this exercise __________ times per day. STRENGTH - Scapular Retractors  Secure a rubber exercise band/tubing so that it is at the height of your shoulders when you are either standing or sitting on a firm arm-less chair.  With a palm-down grip, grasp an end of the band/tubing in each hand. Straighten your elbows and lift your hands straight in front of you at shoulder height. Step back  away from the secured end of band/tubing until it becomes tense.  Squeezing your shoulder blades together, draw your elbows back as you bend them. Keep your upper arm lifted away from your body throughout the exercise.  Hold __________ seconds. Slowly ease the tension on the band/tubing as you reverse the directions and return to the starting position. Repeat __________ times. Complete this exercise __________ times per day. STRENGTH - Shoulder Extensors   Secure a rubber exercise band/tubing so that it is at the height of your shoulders when you are either standing or sitting on a firm arm-less chair.  With a thumbs-up grip, grasp an end of the band/tubing in each hand. Straighten your elbows and lift your hands straight in front of you at shoulder height. Step back away from the secured end of band/tubing until it becomes tense.  Squeezing your shoulder blades together, pull your hands down to the sides of your thighs. Do not allow your hands to go behind you.  Hold for __________ seconds. Slowly ease the tension on the band/tubing as you reverse the directions and return to the starting position. Repeat __________ times. Complete this exercise __________ times per day.  STRENGTH - Scapular Retractors and External Rotators  Secure a rubber exercise band/tubing so that it is at the height of your shoulders when you are either standing or sitting on a firm arm-less chair.  With a palm-down grip, grasp an end of the band/tubing in each hand. Bend your elbows 90 degrees and lift your elbows to shoulder height at your sides. Step back away from the secured end of band/tubing until it becomes tense.  Squeezing your shoulder blades together, rotate your shoulder so that your upper arm and elbow remain stationary, but your fists travel upward to head-height.  Hold __________ for seconds. Slowly ease the tension on the band/tubing as you reverse the directions and return to the starting  position. Repeat __________ times. Complete this exercise __________ times per day.  STRENGTH - Scapular Retractors and External Rotators, Rowing  Secure a rubber exercise band/tubing so that it is at the height of your shoulders when you are either standing or sitting on a firm arm-less chair.  With a palm-down grip, grasp an end of the band/tubing in each hand. Straighten your elbows and lift your hands straight in front of you at shoulder height. Step back away from the secured end of band/tubing until  it becomes tense.  Step 1: Squeeze your shoulder blades together. Bending your elbows, draw your hands to your chest as if you are rowing a boat. At the end of this motion, your hands and elbow should be at shoulder-height and your elbows should be out to your sides.  Step 2: Rotate your shoulder to raise your hands above your head. Your forearms should be vertical and your upper-arms should be horizontal.  Hold for __________ seconds. Slowly ease the tension on the band/tubing as you reverse the directions and return to the starting position. Repeat __________ times. Complete this exercise __________ times per day.  STRENGTH - Scapular Retractors and Elevators  Secure a rubber exercise band/tubing so that it is at the height of your shoulders when you are either standing or sitting on a firm arm-less chair.  With a thumbs-up grip, grasp an end of the band/tubing in each hand. Step back away from the secured end of band/tubing until it becomes tense.  Squeezing your shoulder blades together, straighten your elbows and lift your hands straight over your head.  Hold for __________ seconds. Slowly ease the tension on the band/tubing as you reverse the directions and return to the starting position. Repeat __________ times. Complete this exercise __________ times per day.  Document Released: 02/13/2005 Document Revised: 05/08/2011 Document Reviewed: 05/28/2008 [facility] Patient Information  2015 [place], [place]. This information is not intended to replace advice given to you by your health care provider. Make sure you discuss any questions you have with your health care provider.

## 2014-02-10 NOTE — Assessment & Plan Note (Signed)
Decision today to treat with OMT was based on Physical Exam  After verbal consent patient was treated with HVLA, ME techniques in cervical, thoracici and rib areas  Patient tolerated the procedure well with improvement in symptoms  Patient given exercises, stretches and lifestyle modifications  See medications in patient instructions if given  Patient will follow up in 4 weeks

## 2014-02-10 NOTE — Assessment & Plan Note (Signed)
Patient does have mild this function of the scapula on the left side. No significant winging noted but patient does have weakness compared to the other side. We discussed home exercises and showed proper technique today. We discussed icing regimen and home exercises. We showed patient proper technique. Patient is also to do topical anti-inflammatories and was given another sample. Patient and will come back and see me again in 4 weeks for further evaluation and treatment.

## 2014-02-10 NOTE — Progress Notes (Signed)
Tawana ScaleZach Heloise Gordan D.O. [facility] [address] [place], [place] [address] Phone: [phone] Subjective:    I'm seeing this patient by the request  of:  [name], [name], DO   CC: Left shoulder and neck pain  BJY:NWGNFAOZHYHPI:Subjective Carl Eather ColasKoh is a 48 y.o. male coming in with complaint of left shoulder and neck pain. Patient states that the shoulder is feeling significantly better but continues to have neck pain. Patient states that he continues to have pain on the left side of his neck especially when looking down for a long amount of time. States that it can radiate into his shoulder. Patient states if he moves his shoulder specific direction and it seems to help the pain in his neck. Denies that this is stopping him from any activities. Patient states when he is lifting weight though he can have pain in that he has to stop. Does not notice any weakness. Denies any fevers or chills or any abnormal weight loss.   Past medical history is significant for neck pain previously. Patient did have an MRI of the cervical spine in 2000 was reviewed by me and showed minimal osteophytic changes. Past medical history, social, surgical and family history all reviewed in electronic medical record.   Review of Systems: No headache, visual changes, nausea, vomiting, diarrhea, constipation, dizziness, abdominal pain, skin rash, fevers, chills, night sweats, weight loss, swollen lymph nodes, body aches, joint swelling, muscle aches, chest pain, shortness of breath, mood changes.   Objective Blood pressure 108/64, pulse 53, height 5\' 11"  (1.803 m), weight 181 lb (82.101 kg), SpO2 98 %.  General: No apparent distress alert and oriented x3 mood and affect normal, dressed appropriately.  HEENT: Pupils equal, extraocular movements intact  Respiratory: Patient's speak in full sentences and does not appear short of breath  Cardiovascular: No lower extremity edema, non tender, no erythema  Skin: Warm dry intact with no  signs of infection or rash on extremities or on axial skeleton.  Abdomen: Soft nontender  Neuro: Cranial nerves II through XII are intact, neurovascularly intact in all extremities with 2+ DTRs and 2+ pulses.  Lymph: No lymphadenopathy of posterior or anterior cervical chain or axillae bilaterally.  Gait normal with good balance and coordination.  MSK:  Non tender with full range of motion and good stability and symmetric strength and tone of  elbows, wrist, hip, knee and ankles bilaterally.  Neck: Inspection unremarkable. No palpable stepoffs. Negative Spurling's maneuver. Full neck range of motion Grip strength and sensation normal in bilateral hands Strength good C4 to T1 distribution No sensory change to C4 to T1 Negative Hoffman sign bilaterally Reflexes normal  patient does have tightness around the left scapula mostly of the trapezius and rhomboids.  Shoulder: Left Inspection reveals no abnormalities, atrophy or asymmetry. Palpation is normal with no tenderness over AC joint or bicipital groove. ROM is full in all planes. Rotator cuff strength normal throughout. No impingement today which is better No labral pathology noted with negative Obrien's, negative clunk and good stability. Normal scapular function observed. No painful arc and no drop arm sign. No apprehension sign Mild scapular winging on the left side compared to the contralateral side.  Osteopathic findings Cervical C2 flexed rotated and side bent left C5 flexed rotated and side bent right  Thoracic T3 extended rotated and side bent left with inhaled third rib     Impression and Recommendations:     This case required medical decision making of moderate complexity.

## 2014-03-12 ENCOUNTER — Ambulatory Visit: Payer: BC Managed Care – PPO | Admitting: Family Medicine

## 2014-03-24 ENCOUNTER — Encounter: Payer: Self-pay | Admitting: Family Medicine

## 2014-03-24 ENCOUNTER — Ambulatory Visit (INDEPENDENT_AMBULATORY_CARE_PROVIDER_SITE_OTHER): Payer: BLUE CROSS/BLUE SHIELD | Admitting: Family Medicine

## 2014-03-24 VITALS — BP 98/64 | HR 54 | Temp 97.3°F | Ht 71.0 in | Wt 186.5 lb

## 2014-03-24 DIAGNOSIS — M542 Cervicalgia: Secondary | ICD-10-CM

## 2014-03-24 DIAGNOSIS — G43809 Other migraine, not intractable, without status migrainosus: Secondary | ICD-10-CM

## 2014-03-24 DIAGNOSIS — Z0000 Encounter for general adult medical examination without abnormal findings: Secondary | ICD-10-CM

## 2014-03-24 LAB — LIPID PANEL
Cholesterol: 173 mg/dL (ref 0–200)
HDL: 58.2 mg/dL (ref >39.00)
LDL Cholesterol: 77 mg/dL (ref 0–99)
NonHDL: 114.8
TRIGLYCERIDES: 191 mg/dL — AB (ref 0.0–149.0)
Total CHOL/HDL Ratio: 3
VLDL: 38.2 mg/dL (ref 0.0–40.0)

## 2014-03-24 LAB — HEMOGLOBIN A1C: Hgb A1c MFr Bld: 5.8 % (ref 4.6–6.5)

## 2014-03-24 NOTE — Progress Notes (Signed)
Pre visit review using our clinic review tool, if applicable. No additional management support is needed unless otherwise documented below in the visit note. 

## 2014-03-24 NOTE — Progress Notes (Signed)
HPI:  Here for CPE:  -Concerns and/or follow up today:   Neck and back pain: -seeing Dr. Katrinka BlazingSmith in sports med for this  Migraines: -long hx headaches -3/month last visit -advised journal and imitrex -reports: no headaches in a long time - realized triggers are cold weather and jump roping, other exercise does not cause headaches - reports has had several MRI/MRA in the past and all normal with neurologist - [facility] -denies: change in headaches, weakness, numbness, vision changes  -Diet: reports diet is "very bad" eats out a lot  -Exercise:  regular exercise  -Diabetes and Dyslipidemia Screening: FASTING  -Hx of HTN: no  -Vaccines: UTD  -sexual activity: yes, male partner, no new partners  -wants STI testing, Hep C screening (if born [id number]): no  -FH colon or prstate ca: see FH Last colon cancer screening: n/a Last prostate ca screening: n/a  -Alcohol, Tobacco, drug use: see social history  Review of Systems - no fevers, unintentional weight loss, vision loss, hearing loss, chest pain, sob, hemoptysis, melena, hematochezia, hematuria, genital discharge, changing or concerning skin lesions, bleeding, bruising, loc, thoughts of self harm or SI  Past Medical History  Diagnosis Date  . GERD (gastroesophageal reflux disease)   . Migraine   . Frequent headaches   . Allergy     Past Surgical History  Procedure Laterality Date  . Nissen fundoplication      Family History  Problem Relation Age of Onset  . Heart disease Mother   . Hypertension Mother   . Colon cancer Mother [id number]  . Hypertension Father     History   Social History  . Marital Status: Married    Spouse Name: N/A    Number of Children: N/A  . Years of Education: N/A   Social History Main Topics  . Smoking status: Never Smoker   . Smokeless tobacco: None  . Alcohol Use: No  . Drug Use: None  . Sexual Activity: None   Other Topics Concern  . 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  EXAM:  Filed Vitals:   03/24/14 0911  BP: 98/64  Pulse: 54  Temp: 97.3 F (36.3 C)  TempSrc: Oral  Height: 5\' 11"  (1.803 m)  Weight: 186 lb 8 oz (84.596 kg)    Estimated body mass index is 26.02 kg/(m^2) as calculated from the following:   Height as of this encounter: 5\' 11"  (1.803 m).   Weight as of this encounter: 186 lb 8 oz (84.596 kg).  GENERAL: vitals reviewed and listed below, alert, oriented, appears well hydrated and in no acute distress  HEENT: head atraumatic, PERRLA, normal appearance of eyes, ears, nose and mouth. moist mucus membranes.  NECK: supple, no masses or lymphadenopathy  LUNGS: clear to auscultation bilaterally, no rales, rhonchi or wheeze  CV: HRRR, no peripheral edema or cyanosis, normal pedal pulses  ABDOMEN: bowel sounds normal, soft, non tender to palpation, no masses, no rebound or guarding  GU: deferred  RECTAL: refused  SKIN: no rash or abnormal lesions  MS: normal gait, moves all extremities normally  NEURO: CN II-XII grossly intact, normal muscle strength  and sensation to light touch on extremities  PSYCH: normal affect, pleasant and cooperative  ASSESSMENT AND PLAN:  Discussed the following assessment and plan:  Visit for preventive health examination -Discussed and advised all [place] preventive services health task force level A and B recommendations for age, sex and risks.  -Advised at least 150 minutes of exercise per week and a healthy diet low in saturated fats and sweets and consisting of fresh fruits and vegetables, lean meats such as fish and white chicken and whole grains.  -FASTING labs, studies and vaccines per orders this encounter Neck pain  Other migraine without status  migrainosus, not intractable -resolved, given exertional trigger discussed this and MRI/MRA advised - he declined as apparently had this several times, no change in HAs since so this is reasonable   Patient advised to return to clinic immediately if symptoms worsen or persist or new concerns.  Patient Instructions  BEFORE YOU LEAVE: -labs -follow up in 6 months   -We have ordered labs or studies at this visit. It can take up to 1-2 weeks for results and processing. We will contact you with instructions IF your results are abnormal. Normal results will be released to your [facility]. If you have not heard from [place] or can not find your results in [facility] in 2 weeks please contact our office.  -PLEASE SIGN UP FOR MYCHART TODAY   We recommend the following healthy lifestyle measures: - eat a healthy diet consisting of lots of vegetables, fruits, beans, nuts, seeds, healthy meats such as white chicken and fish and whole grains.  - avoid fried foods, fast food, processed foods, sodas, red meet and other fattening foods.  - get a least 150 minutes of aerobic exercise per week.         No Follow-up on file.   Kriste Basque R.

## 2014-03-24 NOTE — Patient Instructions (Addendum)
BEFORE YOU LEAVE: -labs -follow up in 6 months   -We have ordered labs or studies at this visit. It can take up to 1-2 weeks for results and processing. We will contact you with instructions IF your results are abnormal. Normal results will be [facility] to [facility]. If you have not heard from us or can not find your results [facility] in 2 weeks please contact our office.  -PLEASE SIGN UP FOR MYCHART TODAY   We recommend the following healthy lifestyle measures: - eat a healthy diet consisting of lots of vegetables, fruits, beans, nuts, seeds, healthy meats such as white chicken and fish and whole grains.  - avoid fried foods, fast food, processed foods, sodas, red meet and other fattening foods.  - get a least 150 minutes of aerobic exercise per week.

## 2014-05-19 ENCOUNTER — Encounter: Payer: Self-pay | Admitting: Family Medicine

## 2014-05-19 ENCOUNTER — Ambulatory Visit (INDEPENDENT_AMBULATORY_CARE_PROVIDER_SITE_OTHER): Payer: BLUE CROSS/BLUE SHIELD | Admitting: Family Medicine

## 2014-05-19 VITALS — BP 100/68 | HR 57 | Temp 97.6°F | Ht 71.0 in | Wt 178.1 lb

## 2014-05-19 DIAGNOSIS — R1013 Epigastric pain: Secondary | ICD-10-CM | POA: Diagnosis not present

## 2014-05-19 LAB — LIPASE: Lipase: 10 U/L — ABNORMAL LOW (ref 11.0–59.0)

## 2014-05-19 LAB — HEPATIC FUNCTION PANEL
ALT: 29 U/L (ref 0–53)
AST: 26 U/L (ref 0–37)
Albumin: 4.6 g/dL (ref 3.5–5.2)
Alkaline Phosphatase: 55 U/L (ref 39–117)
BILIRUBIN DIRECT: 0.2 mg/dL (ref 0.0–0.3)
BILIRUBIN TOTAL: 0.8 mg/dL (ref 0.2–1.2)
Total Protein: 7.3 g/dL (ref 6.0–8.3)

## 2014-05-19 NOTE — Progress Notes (Signed)
Pre visit review using our clinic review tool, if applicable. No additional management support is needed unless otherwise documented below in the visit note. 

## 2014-05-19 NOTE — Progress Notes (Signed)
HPI:  Acute visit for:  Abd Pain: -has had this intermittently for 7 years -burning mod-severe pain in epigastric region, some pain in RUQ -has had some intermittent pain on March 2nd after running, then yesterday - 20 minutes twice yesterday  - not associated with meals -denies: nausea, fevers, vomiting, diarrhea, hematochezia, melena -report hx acid reflux and constipation -hx of nissen for bad reflux, did not work, hx of another precedure for his reflux - reports chronic issues with swallowing large amounts of food since these procedures, unchanged   ROS: See pertinent positives and negatives per HPI.  Past Medical History  Diagnosis Date  . GERD (gastroesophageal reflux disease)   . Migraine   . Frequent headaches   . Allergy     Past Surgical History  Procedure Laterality Date  . Nissen fundoplication      Family History  Problem Relation Age of Onset  . Heart disease Mother   . Hypertension Mother   . Colon cancer Mother 45  . Hypertension Father     History   Social History  . Marital Status: Married    Spouse Name: N/A  . Number of Children: N/A  . Years of Education: N/A   Social History Main Topics  . Smoking status: Never Smoker   . Smokeless tobacco: Not on file  . Alcohol Use: No  . Drug Use: Not on file  . Sexual Activity: Not on file   Other Topics Concern  . 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  lansoprazole (PREVACID) 30 MG capsule, Take 30 mg by mouth as needed., Disp: , Rfl:   EXAM:  Filed Vitals:   05/19/14 1100  BP: 100/68  Pulse: 57  Temp: 97.6 F (36.4 C)    Body mass index is 24.85 kg/(m^2).  GENERAL: vitals reviewed and listed above, alert, oriented, appears well hydrated and in no  acute distress  HEENT: atraumatic, conjunttiva clear, no obvious abnormalities on inspection of external nose and ears  NECK: no obvious masses on inspection  LUNGS: clear to auscultation bilaterally, no wheezes, rales or rhonchi, good air movement  CV: HRRR, no peripheral edema  ABD: BS+, soft, NTTP, no rebound or guarding  MS: moves all extremities without noticeable abnormality  PSYCH: pleasant and cooperative, no obvious depression or anxiety  ASSESSMENT AND PLAN:  Discussed the following assessment and plan:  Epigastric pain - Plan: Hepatic Function Panel, US Abdomen Limited RUQ, Lipase  -may be from his acid issues -he is a poor historian and is anxious so is difficult to interpret what is going on -advised labs, RUQ [place], and taking his acid reducer -advised GI eval if persists or worsens and offered GI referral today given his hx, he declined, he is worried about gallstones  -Patient advised to return or notify a doctor immediately if symptoms worsen or persist or new concerns arise.  Patient Instructions  BEFORE YOU LEAVE: -labs  -We placed a referral for you as discussed to get an [place]. It usually takes about 1-2 weeks to process and schedule this referral. If you have not heard from [place] regarding this appointment in  2 weeks please contact our office.  Take your acid medication  If you decide to see the gastroenterologist please let us know      [name], [name]

## 2014-05-19 NOTE — Patient Instructions (Addendum)
BEFORE YOU LEAVE: -labs  -We placed a referral for you as discussed to get an US. It usually takes about 1-2 weeks to process and schedule this referral. If you have not heard from us regarding this appointment in 2 weeks please contact our office.  Take your acid medication  If you decide to see the gastroenterologist please let us know  Miralax daily for days for the constipation  Food Choices for Gastroesophageal Reflux Disease When you have gastroesophageal reflux disease (GERD), the foods you eat and your eating habits are very important. Choosing the right foods can help ease the discomfort of GERD. WHAT GENERAL GUIDELINES DO I NEED TO FOLLOW?  Choose fruits, vegetables, whole grains, low-fat dairy products, and low-fat meat, fish, and poultry.  Limit fats such as oils, salad dressings, butter, nuts, and avocado.  Keep a food diary to identify foods that cause symptoms.  Avoid foods that cause reflux. These may be different for different people.  Eat frequent small meals instead of three large meals each day.  Eat your meals slowly, in a relaxed setting.  Limit fried foods.  Cook foods using methods other than frying.  Avoid drinking alcohol.  Avoid drinking large amounts of liquids with your meals.  Avoid bending over or lying down until 2-3 hours after eating. WHAT FOODS ARE NOT RECOMMENDED? The following are some foods and drinks that may worsen your symptoms: Vegetables Tomatoes. Tomato juice. Tomato and spaghetti sauce. Chili peppers. Onion and garlic. Horseradish. Fruits Oranges, grapefruit, and lemon (fruit and juice). Meats High-fat meats, fish, and poultry. This includes hot dogs, ribs, ham, sausage, salami, and bacon. Dairy Whole milk and chocolate milk. Sour cream. Cream. Butter. Ice cream. Cream cheese.  Beverages Coffee and tea, with or without caffeine. Carbonated beverages or energy drinks. Condiments Hot sauce. Barbecue sauce.   Sweets/Desserts Chocolate and cocoa. Donuts. Peppermint and spearmint. Fats and Oils High-fat foods, including [place] fries and potato chips. Other Vinegar. Strong spices, such as black pepper, white pepper, red pepper, cayenne, curry powder, cloves, ginger, and chili powder. The items listed above may not be a complete list of foods and beverages to avoid. Contact your dietitian for more information. Document Released: 02/13/2005 Document Revised: 02/18/2013 Document Reviewed: 12/18/2012 [facility] Patient Information 2015 [employer] [place]. This information is not intended to replace advice given to you by your health care provider. Make sure you discuss any questions you have with your health care provider.

## 2014-05-26 ENCOUNTER — Ambulatory Visit
Admission: RE | Admit: 2014-05-26 | Discharge: 2014-05-26 | Disposition: A | Discharge status: Home / Self Care | Payer: BLUE CROSS/BLUE SHIELD | Source: Ambulatory Visit | Attending: Family Medicine | Admitting: Family Medicine

## 2014-05-26 DIAGNOSIS — R1013 Epigastric pain: Secondary | ICD-10-CM

## 2014-09-22 ENCOUNTER — Ambulatory Visit: Payer: BLUE CROSS/BLUE SHIELD | Admitting: Family Medicine

## 2014-11-19 ENCOUNTER — Ambulatory Visit (INDEPENDENT_AMBULATORY_CARE_PROVIDER_SITE_OTHER): Payer: BLUE CROSS/BLUE SHIELD | Admitting: Family Medicine

## 2014-11-19 ENCOUNTER — Encounter: Payer: Self-pay | Admitting: Family Medicine

## 2014-11-19 VITALS — BP 110/76 | HR 63 | Temp 97.4°F | Ht 71.0 in | Wt 183.1 lb

## 2014-11-19 DIAGNOSIS — K219 Gastro-esophageal reflux disease without esophagitis: Secondary | ICD-10-CM | POA: Diagnosis not present

## 2014-11-19 DIAGNOSIS — R51 Headache: Secondary | ICD-10-CM | POA: Diagnosis not present

## 2014-11-19 DIAGNOSIS — Z23 Encounter for immunization: Secondary | ICD-10-CM

## 2014-11-19 DIAGNOSIS — F410 Panic disorder [episodic paroxysmal anxiety] without agoraphobia: Secondary | ICD-10-CM | POA: Diagnosis not present

## 2014-11-19 DIAGNOSIS — F411 Generalized anxiety disorder: Secondary | ICD-10-CM | POA: Diagnosis not present

## 2014-11-19 DIAGNOSIS — R519 Headache, unspecified: Secondary | ICD-10-CM

## 2014-11-19 MED ORDER — FLUTICASONE PROPIONATE 50 MCG/ACT NA SUSP: 2.0000 | Freq: Every day | NASAL | Status: DC

## 2014-11-19 NOTE — Progress Notes (Signed)
Pre visit review using our clinic review tool, if applicable. No additional management support is needed unless otherwise documented below in the visit note. 

## 2014-11-19 NOTE — Patient Instructions (Signed)
BEFORE YOU LEAVE: -flu shot -follow up in 1 month  I think these headaches are likely related to your allergies and to caffiene withdrawal: -flonase 2 sprays each nostril EVERY day for 1 month -zyrtec every night 1/2 to 1 tablet -excedrin migraine not more then twice weekly as needed and do not use any other oral pain medications (aleve, tylenol, goodies, asa, etc) -can ty topical menthol (tiger balm is good) avoid getting in eyes  For the GERD: -use the protonix once daily for 2 weeks -diet below  Call today to set up and appointment for help with the panic attacks - see brochure provided

## 2014-11-19 NOTE — Progress Notes (Signed)
HPI:  Carl Moon is a 49 yo M with a PMH of migraines, gerd, frequent headaches and allergies here for an acute visit for:  Headaches: -long hx of headaches at least 3 per months, sometimes more frequent, hx of eval with neurologist in the past in [place] with MRIs/MRAs several times per his report and all normal -triggers: cold, weather changes, jumping rope, allergies -symptoms:bitemp  pain, sinus issues for several weeks and took full course of abx he had without relief,  Sneezing, slear nasal congestion  -stopped drinking coffee suddenly 1 week ago -treatments: advil -denies:fevers, neck pain, chills, worsening facial pain when leaning forward, vision changes (had optho exam a few months ago), weakness, numbness  GAD with panic disorder: -reports he has been given xanax in the past for this -has about 3 panic attacks per year and reports had one last week -reports not anxious in general, but he is always very anxious at his appointments -denies: depression, thoughts of self harm or SI  GERD: -on protonix 40 mg daily the last 4 days -hx intermittent flares of this  ROS: See pertinent positives and negatives per HPI.  Past Medical History  Diagnosis Date  . GERD (gastroesophageal reflux disease)   . Migraine   . Frequent headaches   . Allergy     Past Surgical History  Procedure Laterality Date  . Nissen fundoplication      Family History  Problem Relation Age of Onset  . Heart disease Mother   . Hypertension Mother   . Colon cancer Mother 27  . Hypertension Father     Social History   Social History  . Marital Status: Married    Spouse Name: N/A  . Number of Children: N/A  . Years of Education: N/A   Social History Main Topics  . Smoking status: Never Smoker   . Smokeless tobacco: None  . Alcohol Use: No  . Drug Use: None  . Sexual Activity: Not Asked   Other Topics Concern  . 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  pantoprazole (PROTONIX) 40 MG tablet, , Disp: , Rfl: 2 .  Pseudoephedrine-Ibuprofen (ADVIL COLD/SINUS PO), Take by mouth as needed., Disp: , Rfl:  .  fluticasone (FLONASE) 50 MCG/ACT nasal spray, Place 2 sprays into both nostrils daily., Disp: 16 g, Rfl: 1  EXAM:  Filed Vitals:   11/19/14 1451  BP: 110/76  Pulse: 63  Temp: 97.4 F (36.3 C)    Body mass index is 25.55 kg/(m^2).  GENERAL: vitals reviewed and listed above, alert, oriented, appears well hydrated and in no acute distress  HEENT: atraumatic, conjunttiva clear, PERRLA, no obvious abnormalities on inspection of external nose and ears, normal appearance of ear canals and TMs, clear nasal congestion, mild post oropharyngeal erythema with PND, no tonsillar edema or exudate, some cobblestoning, no sinus TTP  NECK: no obvious masses on inspection, no bruit  LUNGS: clear to auscultation bilaterally, no wheezes, rales or rhonchi, good air movement  CV: HRRR, no peripheral edema  MS: moves all extremities without noticeable abnormality  PSYCH: pleasant and cooperative, no obvious depression, anxious guy  NEUR: normal gait and speech, thought processing grossly intact, CN II-XII groslly intact, finger to nose normal  ASSESSMENT AND PLAN:  Discussed the following assessment and plan:  Nonintractable headache, unspecified chronicity pattern, unspecified headache type  Panic disorder  GAD (generalized anxiety disorder)  Gastroesophageal reflux disease, esophagitis presence not specified  -Patient advised to return or notify a doctor immediately if symptoms worsen or persist or new concerns arise.  Patient Instructions  BEFORE YOU LEAVE: -flu shot -follow up in 1 month  I think these headaches are likely  related to your allergies and to caffiene withdrawal: -flonase 2 sprays each nostril EVERY day for 1 month -zyrtec every night 1/2 to 1 tablet -excedrin migraine not more then twice weekly as needed and do not use any other oral pain medications (aleve, tylenol, goodies, asa, etc) -can ty topical menthol (tiger balm is good) avoid getting in eyes  For the GERD: -use the protonix once daily for 2 weeks -diet below  Call today to set up and appointment for help with the panic attacks - see brochure provided     Kriste Basque R.

## 2014-12-21 ENCOUNTER — Encounter: Payer: Self-pay | Admitting: Family Medicine

## 2014-12-21 ENCOUNTER — Ambulatory Visit (INDEPENDENT_AMBULATORY_CARE_PROVIDER_SITE_OTHER): Payer: BLUE CROSS/BLUE SHIELD | Admitting: Family Medicine

## 2014-12-21 VITALS — BP 110/72 | HR 63 | Temp 97.3°F | Ht 71.0 in | Wt 183.2 lb

## 2014-12-21 DIAGNOSIS — G8929 Other chronic pain: Secondary | ICD-10-CM

## 2014-12-21 DIAGNOSIS — J309 Allergic rhinitis, unspecified: Secondary | ICD-10-CM | POA: Diagnosis not present

## 2014-12-21 DIAGNOSIS — R519 Headache, unspecified: Secondary | ICD-10-CM

## 2014-12-21 DIAGNOSIS — K219 Gastro-esophageal reflux disease without esophagitis: Secondary | ICD-10-CM

## 2014-12-21 DIAGNOSIS — R51 Headache: Secondary | ICD-10-CM | POA: Diagnosis not present

## 2014-12-21 MED ORDER — PANTOPRAZOLE SODIUM 20 MG PO TBEC: 20.0000 mg | DELAYED_RELEASE_TABLET | Freq: Every day | ORAL | Status: DC

## 2014-12-21 MED ORDER — CETIRIZINE HCL 10 MG PO TABS: 10.0000 mg | ORAL_TABLET | Freq: Every day | ORAL | Status: AC

## 2014-12-21 NOTE — Progress Notes (Signed)
HPI:  Headaches/AR: -resolved -feels the flonase really helped -also reports is taking his daughter's rx for cetrizine and reports "nothing" otc "works and is adamant that he have a presciptions for this  GAD with panic disorder: -advised CBT and info provided at last visit as he had reported a panic attack related to the "burning" in his nose when had allergy issues and is always anxious at visits - has hx of remote use of benzo - but reports he didn't think he needed it -reports he doesn't think he needs any tx for anxiety and did not do CBT, denies any further panic attacks -denies: depression, thoughts of self harm or SI  GERD: -reports if does not take 20mg  of protonix - has symptoms -reports nothing OTC works and is adamant about having prescription for this -hx intermittent flares of of reflux and heartburn -denies: dysphagia, weight loss  ROS: See pertinent positives and negatives per HPI.  Past Medical History  Diagnosis Date  . GERD (gastroesophageal reflux disease)   . Migraine   . Frequent headaches   . Allergy     Past Surgical History  Procedure Laterality Date  . Nissen fundoplication      Family History  Problem Relation Age of Onset  . Heart disease Mother   . Hypertension Mother   . Colon cancer Mother [id number]  . Hypertension Father     Social History   Social History  . Marital Status: Married    Spouse Name: N/A  . Number of Children: N/A  . Years of Education: N/A   Social History Main Topics  . Smoking status: Never Smoker   . Smokeless tobacco: None  . Alcohol Use: No  . Drug Use: None  . Sexual Activity: Not Asked   Other Topics Concern  . 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  fluticasone  (FLONASE) 50 MCG/ACT nasal spray, Place 2 sprays into both nostrils daily., Disp: 16 g, Rfl: 1 .  cetirizine (ZYRTEC) 10 MG tablet, Take 1 tablet (10 mg total) by mouth daily., Disp: 30 tablet, Rfl: 11 .  pantoprazole (PROTONIX) 20 MG tablet, Take 1 tablet (20 mg total) by mouth daily., Disp: 30 tablet, Rfl: 2  EXAM:  Filed Vitals:   12/21/14 1428  BP: 110/72  Pulse: 63  Temp: 97.3 F (36.3 C)    Body mass index is 25.56 kg/(m^2).  GENERAL: vitals reviewed and listed above, alert, oriented, appears well hydrated and in no acute distress  HEENT: atraumatic, conjunttiva clear, no obvious abnormalities on inspection of external nose and ears, normal appearance of ear canals and TMs, no nasal congestion, mild post oropharyngeal erythema, no tonsillar edema or exudate, no sinus TTP  NECK: no obvious masses on inspection  LUNGS: clear to auscultation bilaterally, no wheezes, rales or rhonchi, good air movement  CV: HRRR, no peripheral edema  MS: moves all extremities without noticeable abnormality  PSYCH: pleasant and cooperative, no obvious depression or anxiety  ASSESSMENT AND PLAN:  Discussed the following assessment and plan:  Chronic nonintractable headache, unspecified headache type  Allergic rhinitis, unspecified allergic rhinitis type  Gastroesophageal reflux disease,  esophagitis presence not specified  -meds per orders -headaches were likely triggered by caffiene cessation -allergies well controlled -Patient advised to return or notify a doctor immediately if symptoms worsen or persist or new concerns arise.  Patient Instructions  BEFORE YOU LEAVE: -schedule physical in 4-6 months  We recommend the following healthy lifestyle measures: - eat a healthy whole foods diet consisting of regular small meals composed of vegetables, fruits, beans, nuts, seeds, healthy meats such as white chicken and fish and whole grains.  - avoid sweets, white starchy foods, fried  foods, fast food, processed foods, sodas, red meet and other fattening foods.  - get a least 150-300 minutes of aerobic exercise per week.       Kriste Basque R.

## 2014-12-21 NOTE — Progress Notes (Signed)
Pre visit review using our clinic review tool, if applicable. No additional management support is needed unless otherwise documented below in the visit note. 

## 2014-12-21 NOTE — Patient Instructions (Signed)
BEFORE YOU LEAVE: -schedule physical in 4-6 months  We recommend the following healthy lifestyle measures: - eat a healthy whole foods diet consisting of regular small meals composed of vegetables, fruits, beans, nuts, seeds, healthy meats such as white chicken and fish and whole grains.  - avoid sweets, white starchy foods, fried foods, fast food, processed foods, sodas, red meet and other fattening foods.  - get a least 150-300 minutes of aerobic exercise per week.

## 2015-06-22 ENCOUNTER — Encounter: Payer: BLUE CROSS/BLUE SHIELD | Admitting: Family Medicine

## 2015-06-23 ENCOUNTER — Encounter: Payer: BLUE CROSS/BLUE SHIELD | Admitting: Family Medicine

## 2015-10-29 ENCOUNTER — Other Ambulatory Visit: Payer: Self-pay | Admitting: Family Medicine

## 2016-05-01 ENCOUNTER — Telehealth: Payer: Self-pay

## 2016-05-01 ENCOUNTER — Encounter: Payer: Self-pay | Admitting: Family Medicine

## 2016-05-01 ENCOUNTER — Ambulatory Visit (INDEPENDENT_AMBULATORY_CARE_PROVIDER_SITE_OTHER): Payer: BLUE CROSS/BLUE SHIELD | Admitting: Family Medicine

## 2016-05-01 VITALS — BP 100/58 | HR 70 | Temp 98.1°F | Ht 71.0 in | Wt 181.4 lb

## 2016-05-01 DIAGNOSIS — J0100 Acute maxillary sinusitis, unspecified: Secondary | ICD-10-CM | POA: Diagnosis not present

## 2016-05-01 MED ORDER — AMOXICILLIN-POT CLAVULANATE 875-125 MG PO TABS: 1.0000 | ORAL_TABLET | Freq: Two times a day (BID) | ORAL | 0 refills | Status: DC

## 2016-05-01 NOTE — Patient Instructions (Signed)
BEFORE YOU LEAVE: -follow up: CPE in 3 months if due  Take the antibiotic as prescribed.  Call your gastroenterologist today for appointment.  I hope you are feeling better soon! Seek care immediately if worsening, new concerns or you are not improving with treatment.

## 2016-05-01 NOTE — Progress Notes (Signed)
HPI:  Acute visit for sinus congestion: -started: 2 weeks ago and worsening -symptoms:nasal congestion, sore throat, cough, now with thick yellow nasal congestion and sinus pressure and pain -denies:fever, SOB, NVD, tooth pain -has tried: nothing -sick contacts/travel/risks: no reported flu, strep or tick exposure -Hx of: allergies  Also with complaint dysphagia: -for some time -feels like has some difficulty swallowing solids and liquids intermittently -hx GERD, sees GI -reports he will be calling his gastroenterologist about this today -on PPI  ROS: See pertinent positives and negatives per HPI.  Past Medical History:  Diagnosis Date  . Allergy   . Frequent headaches   . GERD (gastroesophageal reflux disease)   . Migraine     Past Surgical History:  Procedure Laterality Date  . NISSEN FUNDOPLICATION      Family History  Problem Relation Age of Onset  . Heart disease Mother   . Hypertension Mother   . Colon cancer Mother [id number]  . Hypertension Father     Social History   Social History  . Marital status: Married    Spouse name: N/A  . Number of children: N/A  . Years of education: N/A   Social History Main Topics  . Smoking status: Never Smoker  . Smokeless tobacco: Never Used  . Alcohol use No  . Drug use: Unknown  . Sexual activity: Not Asked   Other Topics Concern  . 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  cetirizine (ZYRTEC) 10 MG tablet, Take 1 tablet (10 mg total) by mouth daily., Disp: 30 tablet, Rfl: 11 .  fluticasone (FLONASE) 50 MCG/ACT nasal spray, Place 2 sprays into both nostrils daily., Disp: 16 g, Rfl: 1 .  pantoprazole (PROTONIX) 20 MG tablet, TAKE 1 TABLET (20 MG TOTAL) BY MOUTH DAILY., Disp: 30  tablet, Rfl: 2 .  amoxicillin-clavulanate (AUGMENTIN) 875-125 MG tablet, Take 1 tablet by mouth 2 (two) times daily., Disp: 14 tablet, Rfl: 0  EXAM:  Vitals:   05/01/16 1343  BP: (!) 100/58  Pulse: 70  Temp: 98.1 F (36.7 C)    Body mass index is 25.3 kg/m.  GENERAL: vitals reviewed and listed above, alert, oriented, appears well hydrated and in no acute distress  HEENT: atraumatic, conjunttiva clear, no obvious abnormalities on inspection of external nose and ears, normal appearance of ear canals and TMs, thick nasal congestion, mild post oropharyngeal erythema with PND, no tonsillar edema or exudate, no sinus TTP  NECK: no obvious masses on inspection  LUNGS: clear to auscultation bilaterally, no wheezes, rales or rhonchi, good air movement  CV: HRRR, no peripheral edema  MS: moves all extremities without noticeable abnormality  PSYCH: pleasant and cooperative, no obvious depression or anxiety  ASSESSMENT AND PLAN:  Discussed the following assessment and plan:  Acute non-recurrent maxillary sinusitis  -abx for likely sinusitis, risks discussed -advised GI eval for the dysphagia (he already had plan to call his gastroenterologist today - advised he let us know if any difficulty setting up appt); PPI in interim -of course, we advised to return or notify a doctor immediately if symptoms worsen or persist or new concerns arise. -CPE  in 3 months    Patient Instructions  BEFORE YOU LEAVE: -follow up: CPE in 3 months if due  Take the antibiotic as prescribed.  Call your gastroenterologist today for appointment.  I hope you are feeling better soon! Seek care immediately if worsening, new concerns or you are not improving with treatment.      Kriste Basque R., DO

## 2016-05-01 NOTE — Telephone Encounter (Signed)
Spoke with pt and he c/o sinus pain/pressure/headache for several days. He reports constant mucus

## 2016-05-01 NOTE — Telephone Encounter (Signed)
He reports constant mucus and sinus drainage. He states he is having trouble swallowing even water. Advised pt that he needs to be seen. There are no openings here until this afternoon and advised pt that he needs to be evaluated sooner. Ad[facility], as he refuses ED. [facility].

## 2016-05-01 NOTE — Progress Notes (Signed)
Pre visit review using our clinic review tool, if applicable. No additional management support is needed unless otherwise documented below in the visit note. 

## 2016-05-03 ENCOUNTER — Telehealth: Payer: Self-pay | Admitting: Family Medicine

## 2016-05-03 MED ORDER — DOXYCYCLINE HYCLATE 100 MG PO TABS: 100.0000 mg | ORAL_TABLET | Freq: Two times a day (BID) | ORAL | 0 refills | Status: AC

## 2016-05-03 NOTE — Telephone Encounter (Signed)
Patient Name: Carl Moon  DOB: 10/16/1965    Initial Comment Caller was given Amoxicillin for sinus infection and has a question about skin irritation and itchy in small places. Has been on med for 1.5 days.    Nurse Assessment  Nurse: Scarlette ArStandifer, RN, Heather Date/Time (Eastern Time): 05/03/2016 10:03:02 AM  Confirm and document reason for call. If symptomatic, describe symptoms. ---Caller was given Amoxicillin for sinus infection and has a question about skin irritation and itchy in small places. Has been on med for 1.5 days. He was told to go to the ED yesterday, he is not having a rash, but he is itching. He is not going to go to the ED. He wants the medication changed, he is not feeling worse than he was yesterday when triaged.  Does the patient have any new or worsening symptoms? ---No  Please document clinical information provided and list any resource used. ---Called the office backline and spoke with triage nurse and information given.     Guidelines    Guideline Title Affirmed Question Affirmed Notes       Final Disposition User   Clinical Call Standifer, RN, Avery DennisonHeather

## 2016-05-03 NOTE — Telephone Encounter (Signed)
Spoke with pt and advised. Medication added to allergy list. New rx sent to pharmacy. Pt advised to contact office if any further issues with medications. Nothing further needed.

## 2016-05-03 NOTE — Telephone Encounter (Signed)
Patient thinks he may be having allergic reaction to amoxicillin. Pt has developed diffuse hives and itching. Pt denies any angioedema symptoms. Pt would like to have another abx sent in to pharmacy.  Dr. Selena BattenKim is not in office.  Dr. Caryl NeverBurchette - Please advise. Thanks!!

## 2016-05-03 NOTE — Telephone Encounter (Signed)
Discontinue amoxicillin and would list as probable drug allergy. Start doxycycline 100 mg twice daily for 10 days

## 2016-05-09 LAB — TSH: TSH: 1.27 u[IU]/mL (ref 0.41–5.90)

## 2016-05-09 LAB — CBC AND DIFFERENTIAL
HEMATOCRIT: 44 % (ref 41–53)
HEMOGLOBIN: 15.1 g/dL (ref 13.5–17.5)
Platelets: 209 10*3/uL (ref 150–399)
WBC: 6.4 10*3/mL

## 2016-05-09 LAB — BASIC METABOLIC PANEL
BUN: 14 mg/dL (ref 4–21)
Creatinine: 0.9 mg/dL (ref <=1.3)
GLUCOSE: 117 mg/dL
POTASSIUM: 3.9 mmol/L (ref 3.4–5.3)
Sodium: 140 mmol/L (ref 137–147)

## 2016-05-10 ENCOUNTER — Encounter: Payer: Self-pay | Admitting: Family Medicine

## 2016-05-30 ENCOUNTER — Other Ambulatory Visit: Payer: Self-pay | Admitting: *Deleted

## 2016-05-30 MED ORDER — PANTOPRAZOLE SODIUM 20 MG PO TBEC: 20.0000 mg | DELAYED_RELEASE_TABLET | Freq: Every day | ORAL | 3 refills | Status: DC

## 2016-05-30 NOTE — Telephone Encounter (Signed)
Rx done. 

## 2016-06-07 ENCOUNTER — Encounter: Payer: Self-pay | Admitting: Family Medicine

## 2016-06-07 LAB — HM COLONOSCOPY

## 2016-08-22 ENCOUNTER — Other Ambulatory Visit: Payer: Self-pay | Admitting: Family Medicine

## 2016-08-30 ENCOUNTER — Other Ambulatory Visit: Payer: Self-pay | Admitting: Family Medicine

## 2016-10-13 ENCOUNTER — Other Ambulatory Visit: Payer: Self-pay | Admitting: Family Medicine

## 2016-11-16 ENCOUNTER — Encounter: Payer: Self-pay | Admitting: Family Medicine

## 2017-03-08 ENCOUNTER — Encounter: Payer: Self-pay | Admitting: Family Medicine

## 2017-08-08 ENCOUNTER — Ambulatory Visit: Payer: BLUE CROSS/BLUE SHIELD | Admitting: Adult Health

## 2017-08-08 ENCOUNTER — Ambulatory Visit: Payer: Self-pay

## 2017-08-08 ENCOUNTER — Encounter: Payer: Self-pay | Admitting: Adult Health

## 2017-08-08 VITALS — BP 106/74 | Temp 98.0°F | Wt 185.0 lb

## 2017-08-08 DIAGNOSIS — J0141 Acute recurrent pansinusitis: Secondary | ICD-10-CM | POA: Diagnosis not present

## 2017-08-08 MED ORDER — DOXYCYCLINE HYCLATE 100 MG PO CAPS: 100.0000 mg | ORAL_CAPSULE | Freq: Two times a day (BID) | ORAL | 0 refills | Status: DC

## 2017-08-08 NOTE — Progress Notes (Signed)
Subjective:    Patient ID: Attila Mccarthy, male    DOB: 1965/09/19, 52 y.o.   MRN: [id number]  Sinusitis  This is a new problem. The current episode started 1 to 4 weeks ago (2-3 weeks ). The problem has been waxing and waning since onset. There has been no fever. Associated symptoms include chills, congestion, coughing, headaches and sinus pressure. Pertinent negatives include no diaphoresis, ear pain, shortness of breath, sore throat or swollen glands. Treatments tried: motrin  The treatment provided mild relief.     Review of Systems  Constitutional: Positive for chills and fatigue. Negative for diaphoresis and fever.  HENT: Positive for congestion, postnasal drip, rhinorrhea, sinus pressure and sinus pain. Negative for ear discharge, ear pain and sore throat.   Eyes: Negative.   Respiratory: Positive for cough. Negative for shortness of breath.   Cardiovascular: Negative.   Gastrointestinal: Negative.   Endocrine: Negative.   Genitourinary: Negative.   Musculoskeletal: Negative.   Skin: Negative.   Neurological: Positive for headaches.   Past Medical History:  Diagnosis Date  . Allergy   . Frequent headaches   . GERD (gastroesophageal reflux disease)   . Migraine     Social History   Socioeconomic History  . Marital status: Married    Spouse name: Not on file  . Number of children: Not on file  . Years of education: Not on file  . Highest education level: Not on file  Occupational History  . Not on file  Social Needs  . Financial resource strain: Not on file  . Food insecurity:    Worry: Not on file    Inability: Not on file  . Transportation needs:    Medical: Not on file    Non-medical: Not on file  Tobacco Use  . Smoking status: Never Smoker  . Smokeless tobacco: Never Used  Substance and Sexual Activity  . Alcohol use: No  . Drug use: Not on file  . Sexual activity: Not on file  Lifestyle  . Physical activity:    Days per week: Not on file    Minutes per  session: Not on file  . Stress: Not on file  Relationships  . Social connections:    Talks on phone: Not on file    Gets together: Not on file    Attends religious service: Not on file    Active member of club or organization: Not on file    Attends meetings of clubs or organizations: Not on file    Relationship status: Not on file  . Intimate partner violence:    Fear of current or ex partner: Not on file    Emotionally abused: Not on file    Physically abused: Not on file    Forced sexual activity: Not on file  Other Topics Concern  . 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 NISSEN FUNDOPLICATION      Family History  Problem Relation Age of Onset  . Heart disease Mother   . Hypertension Mother   . Colon cancer Mother 7  . Hypertension Father  Allergies  Allergen Reactions  . Amoxicillin Rash    Current Outpatient Medications on File Prior to Visit  Medication Sig Dispense Refill  . cetirizine (ZYRTEC) 10 MG tablet Take 1 tablet (10 mg total) by mouth daily. 30 tablet 11  . fluticasone (FLONASE) 50 MCG/ACT nasal spray SPRAY 2 SPRAYS INTO EACH NOSTRIL EVERY DAY NEEDS APPT 16 g 0  . pantoprazole (PROTONIX) 20 MG tablet TAKE 1 TABLET BY MOUTH EVERY DAY 30 tablet 0   No current facility-administered medications on file prior to visit.     BP 106/74   Temp 98 F (36.7 C) (Oral)   Wt 185 lb (83.9 kg)   BMI 25.80 kg/m       Objective:   Physical Exam  Constitutional: He is oriented to person, place, and time. He appears well-developed and well-nourished. No distress.  HENT:  Head: Normocephalic and atraumatic.  Right Ear: External ear normal.  Left Ear: External ear normal.  Nose: Mucosal edema and  rhinorrhea present. Right sinus exhibits maxillary sinus tenderness and frontal sinus tenderness. Left sinus exhibits maxillary sinus tenderness and frontal sinus tenderness.  Mouth/Throat: Uvula is midline, oropharynx is clear and moist and mucous membranes are normal. No oropharyngeal exudate.  Eyes: Pupils are equal, round, and reactive to light. Conjunctivae and EOM are normal.  Neck: Normal range of motion. Neck supple.  Cardiovascular: Normal rate, regular rhythm, normal heart sounds and intact distal pulses. Exam reveals no gallop and no friction rub.  No murmur heard. Pulmonary/Chest: Effort normal and breath sounds normal. No stridor. No respiratory distress. He has no wheezes. He has no rales. He exhibits no tenderness.  Neurological: He is alert and oriented to person, place, and time.  Skin: Skin is warm and dry. Capillary refill takes less than 2 seconds. He is not diaphoretic.  Psychiatric: He has a normal mood and affect. His behavior is normal. Judgment and thought content normal.  Nursing note and vitals reviewed.     Assessment & Plan:  1. Acute recurrent pansinusitis - doxycycline (VIBRAMYCIN) 100 MG capsule; Take 1 capsule (100 mg total) by mouth 2 (two) times daily.  Dispense: 14 capsule; Refill: 0 - Saline nasal spray or flonase  - Follow up if no improvement   Shirline Freesory Maryanna Stuber, NP

## 2017-08-08 NOTE — Telephone Encounter (Signed)
Pt. Reports having sinus pain and pressure x 3 weeks. At first thought it was a cold. C/o forehead and facial pain that wakes him up at night. Has tried OTC medicine without relief. Denies fever. States he feels like he "has panic attacks ,I feel so bad." Needed late day appointment due to work. Appointment made with Mr. Evelene Croonafziger. Reason for Disposition . [1] Sinus congestion (pressure, fullness) AND [2] present > 10 days  Answer Assessment - Initial Assessment Questions 1. LOCATION: "Where does it hurt?"      Face and forehead 2. ONSET: "When did the sinus pain start?"  (e.g., hours, days)      Started 3 weeks ago 3. SEVERITY: "How bad is the pain?"   (Scale 1-10; mild, moderate or severe)   - MILD (1-3): doesn't interfere with normal activities    - MODERATE (4-7): interferes with normal activities (e.g., work or school) or awakens from sleep   - SEVERE (8-10): excruciating pain and patient unable to do any normal activities        8  4. RECURRENT SYMPTOM: "Have you ever had sinus problems before?" If so, ask: "When was the last time?" and "What happened that time?"      Yes 5. NASAL CONGESTION: "Is the nose blocked?" If so, ask, "Can you open it or must you breathe through the mouth?"     Not blocked 6. NASAL DISCHARGE: "Do you have discharge from your nose?" If so ask, "What color?"     Clear 7. FEVER: "Do you have a fever?" If so, ask: "What is it, how was it measured, and when did it start?"      No 8. OTHER SYMPTOMS: "Do you have any other symptoms?" (e.g., sore throat, cough, earache, difficulty breathing)     Cough 9. PREGNANCY: "Is there any chance you are pregnant?" "When was your last menstrual period?"     n/a  Protocols used: SINUS PAIN OR CONGESTION-A-AH

## 2017-08-16 ENCOUNTER — Telehealth: Payer: Self-pay | Admitting: Family Medicine

## 2017-08-16 NOTE — Telephone Encounter (Signed)
Copied from CRM [id number]. Topic: Quick Communication - See Telephone Encounter >> Aug 16, 2017 12:35 PM Luanna Coleawoud, Jessica L wrote: CRM for notification. See Telephone encounter for: 08/16/17. Pt called and stated that he is still having bad headaches mucus in throat. Sinus pain and very congested. Pt was seen 08/08/17 with same issues. They have not been resolved.

## 2017-08-17 NOTE — Telephone Encounter (Signed)
Called patient and left message to return call to schedule follow-up appointment. Also recommended Saturday clinic if he does not want to until Monday to be seen.

## 2017-08-21 ENCOUNTER — Encounter: Payer: Self-pay | Admitting: Family Medicine

## 2017-08-21 ENCOUNTER — Ambulatory Visit: Payer: BLUE CROSS/BLUE SHIELD | Admitting: Family Medicine

## 2017-08-21 VITALS — BP 100/60 | HR 68 | Ht 71.0 in | Wt 184.7 lb

## 2017-08-21 DIAGNOSIS — J329 Chronic sinusitis, unspecified: Secondary | ICD-10-CM

## 2017-08-21 DIAGNOSIS — J309 Allergic rhinitis, unspecified: Secondary | ICD-10-CM

## 2017-08-21 MED ORDER — DOXYCYCLINE HYCLATE 100 MG PO TABS: 100.0000 mg | ORAL_TABLET | Freq: Two times a day (BID) | ORAL | 0 refills | Status: AC

## 2017-08-21 NOTE — Patient Instructions (Signed)
BEFORE YOU LEAVE: -ear nose and throat doctor numbers -follow up: as needed   Take the doxy for 5 more days.  Please see if dogs can sleep in another location.  Use dust mite covers on bed and mattress. Change sheets weekly. vaccume frequently.  Continue zyrtec and flonase.  Nasal saline twice daily - particularly before bed.  See the Ear, nose and throat doctor. Call the number provider.

## 2017-08-21 NOTE — Progress Notes (Signed)
  HPI:  Using dictation device. Unfortunately this device frequently misinterprets words/phrases.  Acute visit for sinus issues: -reports ongoing for years nasal congestion, bad allergies (to "everything outside", dust mite, dogs, etc), PND - feels like it is gagging him when drains at night, cough, sinus pain -worse the last few weeks with sinus pain maxillary - treated with short course doxy by my collegue - reports improved but symptoms returned when finish abx -sleeps with dogs -no longer getting allergy shots -no fevers, sob, wt loss -reports taking OTC allergy anithistamine from [employer] and flonase  ROS: See pertinent positives and negatives per HPI.  Past Medical History:  Diagnosis Date  . Allergy   . Frequent headaches   . GERD (gastroesophageal reflux disease)   . Migraine     Past Surgical History:  Procedure Laterality Date  . NISSEN FUNDOPLICATION      Family History  Problem Relation Age of Onset  . Heart disease Mother   . Hypertension Mother   . Colon cancer Mother 364  . Hypertension Father     SOCIAL HX:    Current Outpatient Medications:  .  cetirizine (ZYRTEC) 10 MG tablet, Take 1 tablet (10 mg total) by mouth daily., Disp: 30 tablet, Rfl: 11 .  fluticasone (FLONASE) 50 MCG/ACT nasal spray, SPRAY 2 SPRAYS INTO EACH NOSTRIL EVERY DAY NEEDS APPT, Disp: 16 g, Rfl: 0 .  pantoprazole (PROTONIX) 20 MG tablet, TAKE 1 TABLET BY MOUTH EVERY DAY, Disp: 30 tablet, Rfl: 0  EXAM:  Vitals:   08/21/17 1526  BP: 100/60  Pulse: 68  SpO2: 98%    Body mass index is 25.76 kg/m.  GENERAL: vitals reviewed and listed above, alert, oriented, appears well hydrated and in no acute distress  HEENT: atraumatic, conjunttiva clear, no obvious abnormalities on inspection of external nose and ears, normal appearance of ear canals and TMs, thick white nasal congestion, mild post oropharyngeal erythema with PND, no tonsillar edema or exudate, no sinus TTP  NECK: no obvious  masses on inspection  LUNGS: clear to auscultation bilaterally, no wheezes, rales or rhonchi, good air movement  CV: HRRR, no peripheral edema  MS: moves all extremities without noticeable abnormality  PSYCH: pleasant and cooperative, no obvious depression or anxiety  ASSESSMENT AND PLAN:  Discussed the following assessment and plan:  Rhinosinusitis  Allergic rhinitis, unspecified seasonality, unspecified trigger  -extend course abx - thick white nasal congestion and sinus pain -evaluation with ENT, he agrees to call - brochure provided -advised getting dogs out of bed, follow up with allergist as well, measures to reduce dust mites -Patient advised to return or notify a doctor immediately if symptoms worsen or persist or new concerns arise.  Patient Instructions  BEFORE YOU LEAVE: -ear nose and throat doctor numbers -follow up: as needed   Take the doxy for 5 more days.  Please see if dogs can sleep in another location.  Use dust mite covers on bed and mattress. Change sheets weekly. vaccume frequently.  Continue zyrtec and flonase.  Nasal saline twice daily - particularly before bed.  See the Ear, nose and throat doctor. Call the number provider.     Terressa KoyanagiHannah R Yasemin Rabon, DO

## 2017-08-21 NOTE — Telephone Encounter (Signed)
Patient seen in the office today.
